# Patient Record
Sex: Male | Born: 1975 | Race: Black or African American | Hispanic: No | Marital: Married | Smoking: Current every day smoker
Health system: Southern US, Community
[De-identification: ages and names within clinical notes are randomized; demographics above are authoritative.]

## PROBLEM LIST (undated history)

## (undated) DIAGNOSIS — E785 Hyperlipidemia, unspecified: Secondary | ICD-10-CM

## (undated) DIAGNOSIS — M545 Low back pain, unspecified: Secondary | ICD-10-CM

## (undated) DIAGNOSIS — I10 Essential (primary) hypertension: Secondary | ICD-10-CM

## (undated) DIAGNOSIS — G8929 Other chronic pain: Secondary | ICD-10-CM

## (undated) DIAGNOSIS — J45909 Unspecified asthma, uncomplicated: Secondary | ICD-10-CM

## (undated) DIAGNOSIS — G4733 Obstructive sleep apnea (adult) (pediatric): Secondary | ICD-10-CM

## (undated) DIAGNOSIS — G4762 Sleep related leg cramps: Secondary | ICD-10-CM

## (undated) DIAGNOSIS — R079 Chest pain, unspecified: Secondary | ICD-10-CM

## (undated) DIAGNOSIS — R7303 Prediabetes: Secondary | ICD-10-CM

## (undated) DIAGNOSIS — T7840XA Allergy, unspecified, initial encounter: Secondary | ICD-10-CM

## (undated) HISTORY — DX: Morbid (severe) obesity due to excess calories: E66.01

## (undated) HISTORY — DX: Low back pain, unspecified: G89.29

## (undated) HISTORY — DX: Sleep related leg cramps: G47.62

## (undated) HISTORY — DX: Allergy, unspecified, initial encounter: T78.40XA

## (undated) HISTORY — DX: Low back pain, unspecified: M54.50

## (undated) HISTORY — DX: Low back pain: M54.5

## (undated) HISTORY — DX: Chest pain, unspecified: R07.9

## (undated) HISTORY — DX: Prediabetes: R73.03

## (undated) HISTORY — DX: Unspecified asthma, uncomplicated: J45.909

## (undated) HISTORY — DX: Obstructive sleep apnea (adult) (pediatric): G47.33

## (undated) HISTORY — DX: Hyperlipidemia, unspecified: E78.5

---

## 2012-09-09 ENCOUNTER — Encounter: Payer: Self-pay | Admitting: Emergency Medicine

## 2012-09-09 ENCOUNTER — Emergency Department
Admission: EM | Admit: 2012-09-09 | Discharge: 2012-09-09 | Disposition: A | Discharge status: Home / Self Care | Payer: 59 | Source: Home / Self Care | Attending: Family Medicine | Admitting: Family Medicine

## 2012-09-09 DIAGNOSIS — K0889 Other specified disorders of teeth and supporting structures: Secondary | ICD-10-CM

## 2012-09-09 DIAGNOSIS — K089 Disorder of teeth and supporting structures, unspecified: Secondary | ICD-10-CM

## 2012-09-09 HISTORY — DX: Essential (primary) hypertension: I10

## 2012-09-09 MED ORDER — HYDROCODONE-ACETAMINOPHEN 5-325 MG PO TABS: ORAL_TABLET | ORAL | Status: DC

## 2012-09-09 MED ORDER — CLINDAMYCIN HCL 150 MG PO CAPS: ORAL_CAPSULE | ORAL | Status: DC

## 2012-09-09 NOTE — ED Notes (Signed)
Patient has appointment with dentist on January 17th, has been waiting for insurance to go into effect.

## 2012-09-09 NOTE — ED Provider Notes (Signed)
History     CSN: [id number]  Arrival date & time 09/09/12  1318   First MD Initiated Contact with Patient 09/09/12 1349      Chief Complaint  Patient presents with  . Dental Pain     HPI Comments: Patient complains of persistent pain in his left lower wisdom tooth for two weeks.  No fevers, chills, and sweats.  He has a dental appt on 09/26/12  Patient is a 36 y.o. male presenting with tooth pain. The history is provided by the patient.  Dental PainThe primary symptoms include mouth pain and headaches. Primary symptoms do not include dental injury, oral bleeding, oral lesions, fever, shortness of breath, sore throat, angioedema or cough. Episode onset: 2 weeks ago. The symptoms are worsening. The symptoms occur constantly.  Additional symptoms include: dental sensitivity to temperature, gum swelling, gum tenderness and jaw pain. Additional symptoms do not include: purulent gums, trismus, facial swelling, trouble swallowing, pain with swallowing, excessive salivation, dry mouth, taste disturbance, smell disturbance, drooling, ear pain and swollen glands.    Past Medical History  Diagnosis Date  . Hypertension     History reviewed. No pertinent past surgical history.  Family History  Problem Relation Age of Onset  . Cancer Mother   . Asthma Father   . Hypertension Father   . Emphysema Father     History  Substance Use Topics  . Smoking status: Never Smoker   . Smokeless tobacco: Not on file  . Alcohol Use: Yes      Review of Systems  Constitutional: Negative for fever.  HENT: Negative for ear pain, sore throat, facial swelling, drooling and trouble swallowing.   Respiratory: Negative for cough and shortness of breath.   Neurological: Positive for headaches.  All other systems reviewed and are negative.    Allergies  Review of patient's allergies indicates no known allergies.  Home Medications   Current Outpatient Rx  Name  Route  Sig  Dispense  Refill  .  HYDROCHLOROTHIAZIDE 25 MG PO TABS   Oral   Take 25 mg by mouth daily.         . IBUPROFEN 800 MG PO TABS   Oral   Take 800 mg by mouth every 8 (eight) hours as needed.         Marland Kitchen CLINDAMYCIN HCL 150 MG PO CAPS      Take one cap by mouth every 6 hours   28 capsule   0   . HYDROCODONE-ACETAMINOPHEN 5-325 MG PO TABS      Take one by mouth at bedtime as needed for pain   10 tablet   0     BP 165/105  Pulse 81  Temp 97.9 F (36.6 C) (Oral)  Resp 16  Ht 6\' 1"  (1.854 m)  Wt 294 lb (133.358 kg)  BMI 38.79 kg/m2  SpO2 100%  Physical Exam  Nursing note and vitals reviewed. Constitutional: He appears well-developed and well-nourished. No distress.       Patient is obese (BMI 38.8)  HENT:  Head: Normocephalic.  Right Ear: Tympanic membrane and external ear normal.  Left Ear: Tympanic membrane and external ear normal.  Nose: Nose normal.  Mouth/Throat:         Left mandibular tricuspid partly erupted and tender to palpation.  Gingiva also tender but not swollen; does not appear fluctuant.  No purulent drainage.  Eyes: Conjunctivae normal are normal. Pupils are equal, round, and reactive to light.  Neck: Neck supple.  Lymphadenopathy:    He has no cervical adenopathy.    ED Course  Procedures  none      1. Toothache       MDM  Begin Clindamycin.  Lortab for pain at bedtime. Continue Ibuprofen 200mg , 4 tabs every 8 hours with food.  Begin warm salt water gargles Followup with dentist as scheduled.  Red flags discussed. If symptoms become significantly worse during the night or over the weekend, proceed to the local emergency room.         Lattie Haw, MD 09/09/12 [phone]

## 2012-09-09 NOTE — ED Notes (Signed)
Bottom left wisdom tooth pain x 2 weeks

## 2014-01-26 HISTORY — DX: Morbid (severe) obesity due to excess calories: E66.01

## 2015-12-08 ENCOUNTER — Emergency Department
Admission: EM | Admit: 2015-12-08 | Discharge: 2015-12-08 | Disposition: A | Discharge status: Home / Self Care | Payer: Commercial Managed Care - PPO | Source: Home / Self Care | Attending: Family Medicine | Admitting: Family Medicine

## 2015-12-08 DIAGNOSIS — J069 Acute upper respiratory infection, unspecified: Secondary | ICD-10-CM | POA: Diagnosis not present

## 2015-12-08 LAB — POCT INFLUENZA A/B
Influenza A, POC: NEGATIVE
Influenza B, POC: NEGATIVE

## 2015-12-08 LAB — POCT RAPID STREP A (OFFICE): Rapid Strep A Screen: NEGATIVE

## 2015-12-08 NOTE — Discharge Instructions (Signed)
You may take 400-600mg Ibuprofen (Motrin) every 6-8 hours for fever and pain  °Alternate with Tylenol  °You may take 500mg Tylenol every 4-6 hours as needed for fever and pain  °Follow-up with your primary care provider next week for recheck of symptoms if not improving.  °Be sure to drink plenty of fluids and rest, at least 8hrs of sleep a night, preferably more while you are sick. °Return urgent care or go to closest ER if you cannot keep down fluids/signs of dehydration, fever not reducing with Tylenol, difficulty breathing/wheezing, stiff neck, worsening condition, or other concerns (see below)  ° ° °Cool Mist Vaporizers °Vaporizers may help relieve the symptoms of a cough and cold. They add moisture to the air, which helps mucus to become thinner and less sticky. This makes it easier to breathe and cough up secretions. Cool mist vaporizers do not cause serious burns like hot mist vaporizers, which may also be called steamers or humidifiers. Vaporizers have not been proven to help with colds. You should not use a vaporizer if you are allergic to mold. °HOME CARE INSTRUCTIONS °· Follow the package instructions for the vaporizer. °· Do not use anything other than distilled water in the vaporizer. °· Do not run the vaporizer all of the time. This can cause mold or bacteria to grow in the vaporizer. °· Clean the vaporizer after each time it is used. °· Clean and dry the vaporizer well before storing it. °· Stop using the vaporizer if worsening respiratory symptoms develop. °  °This information is not intended to replace advice given to you by your health care provider. Make sure you discuss any questions you have with your health care provider. °  °Document Released: 05/24/2004 Document Revised: 09/01/2013 Document Reviewed: 01/14/2013 °Elsevier Interactive Patient Education ©2016 Elsevier Inc. ° °

## 2015-12-08 NOTE — ED Provider Notes (Signed)
CSN: [id number]     Arrival date & time 12/08/15  1021 History   First MD Initiated Contact with Patient 12/08/15 1035     Chief Complaint  Patient presents with  . Sore Throat  . Cough   (Consider location/radiation/quality/duration/timing/severity/associated sxs/prior Treatment) HPI The pt is a 40yo male presenting to [facility] with c/o nasal congestion, scratchy throat, and mildly productive cough with yellow mucous.  He also reports 2-3 episodes of loose stools. Denies blood or mucous in stools.  Denies sick contacts or recent travel.  He has been taking Mucinex and Sudafed with moderate relief. He did not get the flu vaccine this season.    Past Medical History  Diagnosis Date  . Hypertension    History reviewed. No pertinent past surgical history. Family History  Problem Relation Age of Onset  . Cancer Mother   . Asthma Father   . Hypertension Father   . Emphysema Father    Social History  Substance Use Topics  . Smoking status: Never Smoker   . Smokeless tobacco: None  . Alcohol Use: Yes    Review of Systems  Constitutional: Negative for fever and chills.  HENT: Positive for congestion, postnasal drip, rhinorrhea, sneezing and sore throat. Negative for ear pain, sinus pressure, trouble swallowing and voice change.   Respiratory: Positive for cough. Negative for shortness of breath.   Cardiovascular: Negative for chest pain and palpitations.  Gastrointestinal: Positive for diarrhea ( loose stool). Negative for nausea, vomiting and abdominal pain.  Musculoskeletal: Negative for myalgias, back pain and arthralgias.  Skin: Negative for rash.  Neurological: Negative for dizziness, light-headedness and headaches.    Allergies  Review of patient's allergies indicates no known allergies.  Home Medications   Prior to Admission medications   Medication Sig Start Date End Date Taking? Authorizing Provider  clindamycin (CLEOCIN) 150 MG capsule Take one cap by mouth every 6 hours  09/09/12   Lattie Haw, MD  hydrochlorothiazide (HYDRODIURIL) 25 MG tablet Take 25 mg by mouth daily.    Historical Provider, MD  HYDROcodone-acetaminophen (NORCO/VICODIN) 5-325 MG per tablet Take one by mouth at bedtime as needed for pain 09/09/12   Lattie Haw, MD  ibuprofen (ADVIL,MOTRIN) 800 MG tablet Take 800 mg by mouth every 8 (eight) hours as needed.    Historical Provider, MD   Meds Ordered and Administered this Visit  Medications - No data to display  BP 154/97 mmHg  Pulse 104  Temp(Src) 98.3 F (36.8 C) (Oral)  Ht  (1.88 m)  Wt 295 lb 4 oz (133.925 kg)  BMI 37.89 kg/m2  SpO2 99% No data found.   Physical Exam  Constitutional: He appears well-developed and well-nourished.  HENT:  Head: Normocephalic and atraumatic.  Right Ear: Tympanic membrane normal.  Left Ear: Tympanic membrane normal.  Nose: Mucosal edema present. Right sinus exhibits no maxillary sinus tenderness and no frontal sinus tenderness. Left sinus exhibits no maxillary sinus tenderness and no frontal sinus tenderness.  Mouth/Throat: Uvula is midline and mucous membranes are normal. Posterior oropharyngeal erythema present.  Eyes: Conjunctivae are normal. No scleral icterus.  Neck: Normal range of motion. Neck supple.  Cardiovascular: Normal rate, regular rhythm and normal heart sounds.   Pulmonary/Chest: Effort normal and breath sounds normal. No respiratory distress. He has no wheezes. He has no rales.  Abdominal: Soft. He exhibits no distension. There is no tenderness.  Musculoskeletal: Normal range of motion.  Neurological: He is alert.  Skin: Skin is warm and  dry.  Nursing note and vitals reviewed.   ED Course  Procedures (including critical care time)  Labs Review Labs Reviewed  POCT RAPID STREP A (OFFICE)  POCT INFLUENZA A/B    Imaging Review No results found.    MDM   1. Acute upper respiratory infection    Pt c/o URI symptoms since yesterday.  Rapid strep and flu:  NEGATIVE  No evidence of bacterial infection on exam. Pt appears well.  Encouraged continued symptomatic treatment.  Advised pt to use acetaminophen and ibuprofen as needed for fever and pain. Encouraged rest and fluids. F/u with PCP in 7-10 days if not improving, sooner if worsening. Pt verbalized understanding and agreement with tx plan.     Junius Finnerrin O'Malley, PA-C 12/08/15 1137

## 2015-12-08 NOTE — ED Notes (Signed)
Started with a scratchy itchy throat yesterday morning.  Became worse as the day went on.  This morning having coughing, chest pain when coughing, runny nose, coughing up thick yellow mucous, and having diarrhea.

## 2015-12-11 ENCOUNTER — Telehealth: Payer: Self-pay

## 2016-05-14 ENCOUNTER — Emergency Department
Admission: EM | Admit: 2016-05-14 | Discharge: 2016-05-14 | Disposition: A | Discharge status: Home / Self Care | Payer: Commercial Managed Care - PPO | Source: Home / Self Care | Attending: Family Medicine | Admitting: Family Medicine

## 2016-05-14 ENCOUNTER — Encounter: Payer: Self-pay | Admitting: *Deleted

## 2016-05-14 DIAGNOSIS — K047 Periapical abscess without sinus: Secondary | ICD-10-CM

## 2016-05-14 MED ORDER — ACETAMINOPHEN 325 MG PO TABS
650.0000 mg | ORAL_TABLET | Freq: Once | ORAL | Status: AC
  Administered 2016-05-14: 650 mg via ORAL

## 2016-05-14 MED ORDER — AMOXICILLIN 500 MG PO CAPS: 500.0000 mg | ORAL_CAPSULE | Freq: Three times a day (TID) | ORAL | 0 refills | Status: DC

## 2016-05-14 NOTE — ED Triage Notes (Signed)
Pt c/o RT side lower wisdom tooth pain and swelling x 3 days. Denies fever. He took IBF 400mg  at 0600 today.

## 2016-05-14 NOTE — ED Provider Notes (Signed)
CSN: [id number]     Arrival date & time 05/14/16  [id number] History   First MD Initiated Contact with Patient 05/14/16 0915     Chief Complaint  Patient presents with  . Dental Pain   (Consider location/radiation/quality/duration/timing/severity/associated sxs/prior Treatment) HPI Randy HoffmannJames M Thompson is a 40 y.o. male presenting to UC with c/o Right lower back pain over his wisdom tooth that started about 2 days ago. Pain is aching and throbbing.  Pain is 8/10, worse with chewing.  He reports hx of abscess in the past and did need a tooth removed. He does have a dentist but [facility]  Denies chewing tobacco or smoking. No fever, chills, n/v/d.    Past Medical History:  Diagnosis Date  . Hypertension    History reviewed. No pertinent surgical history. Family History  Problem Relation Age of Onset  . Cancer Mother   . Asthma Father   . Hypertension Father   . Emphysema Father    Social History  Substance Use Topics  . Smoking status: Never Smoker  . Smokeless tobacco: Never Used  . Alcohol use Yes    Review of Systems  Constitutional: Negative for chills and fever.  HENT: Positive for dental problem. Negative for congestion, ear pain and sore throat.   Gastrointestinal: Negative for nausea and vomiting.    Allergies  Review of patient's allergies indicates no known allergies.  Home Medications   Prior to Admission medications   Medication Sig Start Date End Date Taking? Authorizing Provider  amLODipine (NORVASC) 5 MG tablet Take 5 mg by mouth daily.   Yes Historical Provider, MD  amoxicillin (AMOXIL) 500 MG capsule Take 1 capsule (500 mg total) by mouth 3 (three) times daily. 05/14/16   Junius FinnerErin O'Malley, PA-C  ibuprofen (ADVIL,MOTRIN) 800 MG tablet Take 800 mg by mouth every 8 (eight) hours as needed.    Historical Provider, MD   Meds Ordered and Administered this Visit   Medications  acetaminophen (TYLENOL) tablet 650 mg (650 mg Oral Given 05/14/16  0912)    BP (!) 162/112 (BP Location: Left Arm)   Pulse 83   Temp 98.1 F (36.7 C) (Oral)   Resp 18   Ht 6\' 2"  (1.88 m)   Wt 297 lb (134.7 kg)   SpO2 100%   BMI 38.13 kg/m  No data found.   Physical Exam  Constitutional: He is oriented to person, place, and time. He appears well-developed and well-nourished.  HENT:  Head: Normocephalic and atraumatic.  Nose: Nose normal.  Mouth/Throat: Uvula is midline, oropharynx is clear and moist and mucous membranes are normal. No trismus in the jaw. Dental caries present. No dental abscesses.    Multiple dental caries and tartar buildup. Tenderness to Right lower back teeth along tongue side of gingiva. Mild gingival erythema and edema with tenderness. No bleeding or discharge.   Eyes: EOM are normal.  Neck: Normal range of motion.  Cardiovascular: Normal rate.   Pulmonary/Chest: Effort normal.  Musculoskeletal: Normal range of motion.  Neurological: He is alert and oriented to person, place, and time.  Skin: Skin is warm and dry.  Psychiatric: He has a normal mood and affect. His behavior is normal.  Nursing note and vitals reviewed.   Urgent Care Course   Clinical Course    Procedures (including critical care time)  Labs Review Labs Reviewed - No data to display  Imaging Review No results found.   MDM   1. Dental abscess  Pt presenting to UC with dental pain. Hx of dental abscesses in the past.  Exam c/w early dental abscess. No large abscess to indicate I&D at this time.  Rx: amoxicillin Encouraged salt water gargles. F/u with dentist later this week for recheck of symptoms and continued dental care. Patient verbalized understanding and agreement with treatment plan.     Junius Finner, PA-C 05/14/16 [phone]

## 2017-01-21 DIAGNOSIS — G4733 Obstructive sleep apnea (adult) (pediatric): Secondary | ICD-10-CM | POA: Insufficient documentation

## 2017-01-21 HISTORY — DX: Obstructive sleep apnea (adult) (pediatric): G47.33

## 2017-03-05 LAB — LIPID PANEL
CHOLESTEROL: 215 — AB (ref 0–200)
HDL: 38 (ref 35–70)
LDL CALC: 132
LDl/HDL Ratio: 5.7
Triglycerides: 291 — AB (ref 40–160)

## 2017-03-05 LAB — BASIC METABOLIC PANEL
BUN: 10 (ref 4–21)
Creatinine: 1.3 (ref 0.6–1.3)
GLUCOSE: 87
POTASSIUM: 4.5 (ref 3.4–5.3)
Sodium: 139 (ref 137–147)

## 2017-03-05 LAB — HEMOGLOBIN A1C: HEMOGLOBIN A1C: 5.7

## 2017-05-10 ENCOUNTER — Encounter: Payer: Self-pay | Admitting: Physician Assistant

## 2017-05-10 ENCOUNTER — Ambulatory Visit (INDEPENDENT_AMBULATORY_CARE_PROVIDER_SITE_OTHER): Payer: Commercial Managed Care - PPO

## 2017-05-10 ENCOUNTER — Ambulatory Visit (INDEPENDENT_AMBULATORY_CARE_PROVIDER_SITE_OTHER): Payer: Commercial Managed Care - PPO | Admitting: Physician Assistant

## 2017-05-10 VITALS — BP 116/72 | HR 93 | Ht 73.0 in | Wt 283.5 lb

## 2017-05-10 DIAGNOSIS — I10 Essential (primary) hypertension: Secondary | ICD-10-CM | POA: Diagnosis not present

## 2017-05-10 DIAGNOSIS — G8929 Other chronic pain: Secondary | ICD-10-CM

## 2017-05-10 DIAGNOSIS — R7303 Prediabetes: Secondary | ICD-10-CM

## 2017-05-10 DIAGNOSIS — G4762 Sleep related leg cramps: Secondary | ICD-10-CM | POA: Insufficient documentation

## 2017-05-10 DIAGNOSIS — M25562 Pain in left knee: Secondary | ICD-10-CM | POA: Insufficient documentation

## 2017-05-10 DIAGNOSIS — R079 Chest pain, unspecified: Secondary | ICD-10-CM

## 2017-05-10 DIAGNOSIS — E785 Hyperlipidemia, unspecified: Secondary | ICD-10-CM

## 2017-05-10 DIAGNOSIS — Z7689 Persons encountering health services in other specified circumstances: Secondary | ICD-10-CM

## 2017-05-10 HISTORY — DX: Sleep related leg cramps: G47.62

## 2017-05-10 HISTORY — DX: Prediabetes: R73.03

## 2017-05-10 HISTORY — DX: Other chronic pain: G89.29

## 2017-05-10 MED ORDER — AMLODIPINE BESYLATE 5 MG PO TABS: 5.0000 mg | ORAL_TABLET | Freq: Every day | ORAL | 1 refills | Status: DC

## 2017-05-10 MED ORDER — MELOXICAM 15 MG PO TABS: 15.0000 mg | ORAL_TABLET | Freq: Every day | ORAL | 0 refills | Status: DC

## 2017-05-10 MED ORDER — B COMPLEX VITAMINS PO CAPS: 1.0000 | ORAL_CAPSULE | Freq: Every day | ORAL | 11 refills | Status: DC

## 2017-05-10 NOTE — Patient Instructions (Signed)
-   Meloxicam once daily for 2 weeks, then daily as needed. No Ibuprofen or other OTC pain relievers except Tylenol - X-ray today   Knee Pain, Adult Many things can cause knee pain. The pain often goes away on its own with time and rest. If the pain does not go away, tests may be done to find out what is causing the pain. Follow these instructions at home: Activity  Rest your knee.  Do not do things that cause pain.  Avoid activities where both feet leave the ground at the same time (high-impact activities). Examples are running, jumping rope, and doing jumping jacks. General instructions  Take medicines only as told by your doctor.  Raise (elevate) your knee when you are resting. Make sure your knee is higher than your heart.  Sleep with a pillow under your knee.  If told, put ice on the knee: ? Put ice in a plastic bag. ? Place a towel between your skin and the bag. ? Leave the ice on for 20 minutes, 2-3 times a day.  Ask your doctor if you should wear an elastic knee support.  Lose weight if you are overweight. Being overweight can make your knee hurt more.  Do not use any tobacco products. These include cigarettes, chewing tobacco, or electronic cigarettes. If you need help quitting, ask your doctor. Smoking may slow down healing. Contact a doctor if:  The pain does not stop.  The pain changes or gets worse.  You have a fever along with knee pain.  Your knee gives out or locks up.  Your knee swells, and becomes worse. Get help right away if:  Your knee feels warm.  You cannot move your knee.  You have very bad knee pain.  You have chest pain.  You have trouble breathing. Summary  Many things can cause knee pain. The pain often goes away on its own with time and rest.  Avoid activities that put stress on your knee. These include running and jumping rope.  Get help right away if you cannot move your knee, or if your knee feels warm, or if you have trouble  breathing. This information is not intended to replace advice given to you by your health care provider. Make sure you discuss any questions you have with your health care provider. Document Released: 11/23/2008 Document Revised: 08/21/2016 Document Reviewed: 08/21/2016 Elsevier Interactive Patient Education  2017 [employer]

## 2017-05-10 NOTE — Progress Notes (Signed)
HPI:                                                                Randy HoffmannJames M Thompson is a 41 y.o. male [facility] Medicine today to establish care  Patient reports left anterior medial thigh pain x 2 weeks. Pain is moderate, persistent. Denies known injury or trauma. Denies paresthesias, numbness tingling. Denies weakness or falls. Denies swelling or redness. Patient also reports nightly cramps in his right upper leg that wake him from sleep.   HTN: taking Amlodipine daily. Compliant with medications. Does not monitor BP's at home. Denies vision change, headache, chest pain with exertion, orthopnea, lightheadedness, syncope and edema. He has a history of chronic chest pain. He had an exercise stress test that was negative for ischemia, but Risk factors include: male sex, obesity, OSA, HLD  Mild OSA: not on CPAP therapy  HLD: taking pravastatin daily   Past Medical History:  Diagnosis Date  . Allergy   . Asthma    childhood  . Chronic chest pain 05/10/2017  . Chronic low back pain   . Hyperlipidemia   . Hypertension   . 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 Morbid obesity (HCC) 01/26/2014  . Nocturnal leg cramps 05/10/2017  . Prediabetes 05/10/2017   History reviewed. No pertinent surgical history. Social History  Substance Use Topics  . Smoking status: Never Smoker  . Smokeless tobacco: Never Used  . Alcohol use Yes   family history includes Asthma in his father; Cancer in his mother; Emphysema in his father; Hypertension in his father.  ROS: negative except as noted in the HPI  Medications: Current Outpatient Prescriptions  Medication Sig Dispense Refill  . amLODipine (NORVASC) 5 MG tablet Take 1 tablet (5 mg total) by mouth daily. 90 tablet 1  . loratadine (CLARITIN) 10 MG tablet Take 10 mg by mouth daily.    . Melatonin 5 MG CAPS Take 5 mg by mouth daily as needed.    . pravastatin (PRAVACHOL) 20 MG tablet Take 20  mg by mouth daily.    Marland Kitchen. b complex vitamins capsule Take 1 capsule by mouth daily. 30 capsule 11  . meloxicam (MOBIC) 15 MG tablet Take 1 tablet (15 mg total) by mouth daily. 30 tablet 0   No current facility-administered medications for this visit.    Allergies  Allergen Reactions  . Penicillins Rash       Objective:  BP 116/72   Pulse 93   Ht 6\' 1"  (1.854 m)   Wt 283 lb 8 oz (128.6 kg)   SpO2 99%   BMI 37.40 kg/m  Gen:  alert, not ill-appearing, no distress, appropriate for age, obese male HEENT: head normocephalic without obvious abnormality, conjunctiva and cornea clear, trachea midline Pulm: Normal work of breathing, normal phonation CV: normal rate, regular rhythm, s1 and s2 distinct, no murmurs Neuro: alert and oriented x 3, no tremor MSK: extremities atraumatic, left knee without visible swelling or effusion, no redness or warmth of the joint, there is medial joint line tenderness and pain with extension, right knee normal, normal gait and station Skin: intact, no rashes on exposed skin, no jaundice, no cyanosis Psych: well-groomed, cooperative, good eye contact, euthymic mood, affect  mood-congruent, speech is articulate, and thought processes clear and goal-directed  Depression screen [facility] 2/9 05/10/2017  Decreased Interest 0  Down, Depressed, Hopeless 0  PHQ - 2 Score 0     No results found for this or any previous visit (from the past 72 hour(s)). Dg Knee 1-2 Views Right  Result Date: 05/11/2017 CLINICAL DATA:  Pt with Lt knee pain for 2 weeks. No hx surg. Bilateral ap and tunnel views per DR. EXAM: RIGHT KNEE - 1-2 VIEW COMPARISON:  None. FINDINGS: Standing views demonstrates mild narrowing of the medial joint space which is favored within normal limits. No osteophytosis. No joint effusion. IMPRESSION: No acute osseous abnormality. Electronically Signed   By: Genevive Bi M.D.   On: 05/11/2017 08:45   Dg Knee Complete 4 Views Left  Result Date:  05/11/2017 CLINICAL DATA:  Bilateral knee pain. EXAM: LEFT KNEE - COMPLETE 4+ VIEW COMPARISON:  None. FINDINGS: Standing view of the knees demonstrate mild joint space narrowing medially which isfelt within normal limits. No osteophytosis. No joint effusion. IMPRESSION: No osseous abnormality. Electronically Signed   By: Genevive Bi M.D.   On: 05/11/2017 08:33      Assessment and Plan: 41 y.o. male with   1. Encounter to establish care - reviewed PMH, PSH, PFH, medications, allergies - reviewed health maintenance - negative PHQ2  2. Hypertension goal BP (blood pressure) < 130/80 BP Readings from Last 3 Encounters:  05/10/17 116/72  05/14/16 (!) 162/112  12/08/15 154/97  - BP in range today. Cont Norvasc - amLODipine (NORVASC) 5 MG tablet; Take 1 tablet (5 mg total) by mouth daily.  Dispense: 90 tablet; Refill: 1  3. Nocturnal leg cramps - lifestyle measures including regular exercise, hydration, stretching before bed. Trial of B complex vitamin daily - b complex vitamins capsule; Take 1 capsule by mouth daily.  Dispense: 30 capsule; Refill: 11  4. Acute pain of left knee - daily antiinflammatory, knee x-rays, avoid high impact activity, follow-up with Sports Medicine - meloxicam (MOBIC) 15 MG tablet; Take 1 tablet (15 mg total) by mouth daily.  Dispense: 30 tablet; Refill: 0 - DG Knee Complete 4 Views Left; Future - DG Knee 1-2 Views Right; Future  5. Prediabetes - A1C 5.7 (03/05/17) - recheck A1C in 6 months  6. Hyperlipidemia - taking pravastatin daily - fasting lipid panel in 6 months  Patient education and anticipatory guidance given Patient agrees with treatment plan Follow-up in 2 weeks with Sports Medicine for knee pain or sooner as needed if symptoms worsen or fail to improve  Levonne Hubert PA-C

## 2017-05-14 ENCOUNTER — Encounter: Payer: Self-pay | Admitting: Physician Assistant

## 2017-05-14 DIAGNOSIS — J3089 Other allergic rhinitis: Secondary | ICD-10-CM | POA: Insufficient documentation

## 2017-05-14 DIAGNOSIS — E785 Hyperlipidemia, unspecified: Secondary | ICD-10-CM | POA: Insufficient documentation

## 2017-05-14 DIAGNOSIS — Z8709 Personal history of other diseases of the respiratory system: Secondary | ICD-10-CM | POA: Insufficient documentation

## 2017-05-14 NOTE — Progress Notes (Signed)
Hi Taivon,  Your knee x-rays are normal. Continue with treatment plan discussed and follow-up with Sports Medicine in approximately 2 weeks if knee pain persists.  Best, Vinetta Bergamoharley

## 2017-05-16 ENCOUNTER — Telehealth: Payer: Self-pay | Admitting: Physician Assistant

## 2017-05-16 NOTE — Telephone Encounter (Signed)
Wife returning call regarding "recommendations for husband".  Thank you.

## 2017-05-17 NOTE — Telephone Encounter (Signed)
Left vm for wife to return call to clinic. Not sure what she was calling about. -EH/RMA

## 2017-05-20 ENCOUNTER — Ambulatory Visit (INDEPENDENT_AMBULATORY_CARE_PROVIDER_SITE_OTHER): Payer: Commercial Managed Care - PPO

## 2017-05-20 ENCOUNTER — Ambulatory Visit (INDEPENDENT_AMBULATORY_CARE_PROVIDER_SITE_OTHER): Payer: Commercial Managed Care - PPO | Admitting: Sports Medicine

## 2017-05-20 ENCOUNTER — Encounter: Payer: Self-pay | Admitting: Sports Medicine

## 2017-05-20 DIAGNOSIS — M5416 Radiculopathy, lumbar region: Secondary | ICD-10-CM

## 2017-05-20 MED ORDER — PREDNISONE 50 MG PO TABS: ORAL_TABLET | ORAL | 0 refills | Status: DC

## 2017-05-20 NOTE — Progress Notes (Signed)
   Subjective:    I'm seeing this patient as a consultation for:  Gena Frayharley Cummings, PA-C  CC: left knee and leg pain  HPI: This is a pleasant 41 year old male, for the past several months he said pain over the lateral aspect of his left knee, radiating from the lateral thigh, over to the anterior lower leg to the foot. Also gets some numbness and tingling in the right foot. Symptoms are moderate, persistent, worse with driving and sitting. No bowel or bladder dysfunction, saddle numbness, constitutional symptoms.  Past medical history, Surgical history, Family history not pertinant except as noted below, Social history, Allergies, and medications have been entered into the medical record, reviewed, and no changes needed.   Review of Systems: No headache, visual changes, nausea, vomiting, diarrhea, constipation, dizziness, abdominal pain, skin rash, fevers, chills, night sweats, weight loss, swollen lymph nodes, body aches, joint swelling, muscle aches, chest pain, shortness of breath, mood changes, visual or auditory hallucinations.   Objective:   General: Well Developed, well nourished, and in no acute distress.  Neuro:  Extra-ocular muscles intact, able to move all 4 extremities, sensation grossly intact.  Deep tendon reflexes tested were normal. Psych: Alert and oriented, mood congruent with affect. ENT:  Ears and nose appear unremarkable.  Hearing grossly normal. Neck: Unremarkable overall appearance, trachea midline.  No visible thyroid enlargement. Eyes: Conjunctivae and lids appear unremarkable.  Pupils equal and round. Skin: Warm and dry, no rashes noted.  Cardiovascular: Pulses palpable, no extremity edema. Left Knee: Normal to inspection with no erythema or effusion or obvious bony abnormalities. Palpation normal with no warmth or joint line tenderness or patellar tenderness or condyle tenderness. ROM normal in flexion and extension and lower leg rotation. Ligaments with solid  consistent endpoints including ACL, PCL, LCL, MCL. Negative Mcmurray's and provocative meniscal tests. Non painful patellar compression. Patellar and quadriceps tendons unremarkable. Hamstring and quadriceps strength is normal. Back Exam:  Inspection: Unremarkable  Motion: Flexion 45 deg, Extension 45 deg, Side Bending to 45 deg bilaterally,  Rotation to 45 deg bilaterally  SLR laying: Negative  XSLR laying: Negative  Palpable tenderness: None. FABER: negative. Sensory change: Gross sensation intact to all lumbar and sacral dermatomes.  Reflexes: 2+ at both patellar tendons, 2+ at achilles tendons, Babinski's downgoing.  Strength at foot  Plantar-flexion: 5/5 Dorsi-flexion: 5/5 Eversion: 5/5 Inversion: 5/5  Leg strength  Quad: 5/5 Hamstring: 5/5 Hip flexor: 5/5 Hip abductors: 5/5  Gait unremarkable.  Lumbar spine x-rays reviewed, there is minimal lower thoracic degenerative disc disease but overall the lumbar spine looks okay.  Impression and Recommendations:   This case required medical decision making of moderate complexity.  Lumbar radiculitis Prednisone, lumbar spine x-rays, home rehabilitation exercises.  Return to see me in one month, MRI for interventional planning if no better. Meloxicam is only mild to moderately effective.  ___________________________________________ Ihor Austinhomas J. Benjamin Stainhekkekandam, M.D., ABFM., CAQSM. Primary Care and Sports Medicine [facility]  Adjunct Instructor of Family Medicine  [employer]

## 2017-05-20 NOTE — Assessment & Plan Note (Signed)
Prednisone, lumbar spine x-rays, home rehabilitation exercises.  Return to see me in one month, MRI for interventional planning if no better. Meloxicam is only mild to moderately effective.

## 2017-05-28 ENCOUNTER — Ambulatory Visit: Payer: Commercial Managed Care - PPO | Admitting: Physician Assistant

## 2017-06-17 ENCOUNTER — Encounter: Payer: Self-pay | Admitting: Sports Medicine

## 2017-06-17 ENCOUNTER — Ambulatory Visit (INDEPENDENT_AMBULATORY_CARE_PROVIDER_SITE_OTHER): Payer: Commercial Managed Care - PPO | Admitting: Sports Medicine

## 2017-06-17 DIAGNOSIS — M5416 Radiculopathy, lumbar region: Secondary | ICD-10-CM

## 2017-06-17 NOTE — Assessment & Plan Note (Signed)
Improved significantly but not really doing the rehabilitation exercises. He will get more diligent with the rehabilitation exercises, and if still with symptoms in a month we will proceed with an MRI for interventional planning.

## 2017-06-17 NOTE — Progress Notes (Signed)
  Subjective:    CC: Follow-up  HPI: This is a pleasant 41 year old male with lumbar radiculitis, we did prednisone, rehabilitation exercises, he's improved considerably, he admits to not being compliant with his rehabilitation exercises. No bowel or bladder dysfunction, saddle numbness, constitutional symptoms.  Past medical history:  Negative.  See flowsheet/record as well for more information.  Surgical history: Negative.  See flowsheet/record as well for more information.  Family history: Negative.  See flowsheet/record as well for more information.  Social history: Negative.  See flowsheet/record as well for more information.  Allergies, and medications have been entered into the medical record, reviewed, and no changes needed.   Review of Systems: No fevers, chills, night sweats, weight loss, chest pain, or shortness of breath.   Objective:    General: Well Developed, well nourished, and in no acute distress.  Neuro: Alert and oriented x3, extra-ocular muscles intact, sensation grossly intact.  HEENT: Normocephalic, atraumatic, pupils equal round reactive to light, neck supple, no masses, no lymphadenopathy, thyroid nonpalpable.  Skin: Warm and dry, no rashes. Cardiac: Regular rate and rhythm, no murmurs rubs or gallops, no lower extremity edema.  Respiratory: Clear to auscultation bilaterally. Not using accessory muscles, speaking in full sentences. Back Exam:  Inspection: Unremarkable  Motion: Flexion 45 deg, Extension 45 deg, Side Bending to 45 deg bilaterally,  Rotation to 45 deg bilaterally  SLR laying: Negative  XSLR laying: Negative  Palpable tenderness: None. FABER: negative. Sensory change: Gross sensation intact to all lumbar and sacral dermatomes.  Reflexes: 2+ at both patellar tendons, 2+ at achilles tendons, Babinski's downgoing.  Strength at foot  Plantar-flexion: 5/5 Dorsi-flexion: 5/5 Eversion: 5/5 Inversion: 5/5  Leg strength  Quad: 5/5 Hamstring: 5/5 Hip  flexor: 5/5 Hip abductors: 5/5  Gait unremarkable.  Impression and Recommendations:    Lumbar radiculitis Improved significantly but not really doing the rehabilitation exercises. He will get more diligent with the rehabilitation exercises, and if still with symptoms in a month we will proceed with an MRI for interventional planning.  ___________________________________________ Ihor Austin. Benjamin Stain, M.D., ABFM., CAQSM. Primary Care and Sports Medicine [facility]  Adjunct Instructor of Family Medicine  [employer]

## 2017-06-19 ENCOUNTER — Encounter: Payer: Self-pay | Admitting: Sports Medicine

## 2017-06-20 MED ORDER — MELOXICAM 15 MG PO TABS: ORAL_TABLET | ORAL | 3 refills | Status: DC

## 2017-06-20 NOTE — Telephone Encounter (Signed)
Sending in meloxicam.

## 2017-06-23 ENCOUNTER — Encounter: Payer: Self-pay | Admitting: Physician Assistant

## 2017-06-23 DIAGNOSIS — E785 Hyperlipidemia, unspecified: Secondary | ICD-10-CM

## 2017-06-25 ENCOUNTER — Other Ambulatory Visit: Payer: Self-pay | Admitting: Physician Assistant

## 2017-06-25 DIAGNOSIS — E782 Mixed hyperlipidemia: Secondary | ICD-10-CM

## 2017-06-25 MED ORDER — PRAVASTATIN SODIUM 20 MG PO TABS: 20.0000 mg | ORAL_TABLET | Freq: Every day | ORAL | 0 refills | Status: DC

## 2017-06-25 NOTE — Telephone Encounter (Signed)
rx sent per PCP recommendation.

## 2017-06-26 ENCOUNTER — Encounter: Payer: Self-pay | Admitting: Physician Assistant

## 2017-07-20 ENCOUNTER — Other Ambulatory Visit: Payer: Self-pay | Admitting: Physician Assistant

## 2017-07-20 DIAGNOSIS — E785 Hyperlipidemia, unspecified: Secondary | ICD-10-CM

## 2017-08-20 ENCOUNTER — Other Ambulatory Visit: Payer: Self-pay | Admitting: Physician Assistant

## 2017-08-20 DIAGNOSIS — E785 Hyperlipidemia, unspecified: Secondary | ICD-10-CM

## 2017-09-30 ENCOUNTER — Other Ambulatory Visit: Payer: Self-pay

## 2017-09-30 DIAGNOSIS — E785 Hyperlipidemia, unspecified: Secondary | ICD-10-CM

## 2017-09-30 MED ORDER — PRAVASTATIN SODIUM 20 MG PO TABS: 20.0000 mg | ORAL_TABLET | Freq: Every day | ORAL | 0 refills | Status: DC

## 2017-11-10 ENCOUNTER — Other Ambulatory Visit: Payer: Self-pay | Admitting: Physician Assistant

## 2017-11-10 DIAGNOSIS — E785 Hyperlipidemia, unspecified: Secondary | ICD-10-CM

## 2017-11-13 ENCOUNTER — Other Ambulatory Visit: Payer: Self-pay | Admitting: Physician Assistant

## 2017-11-13 DIAGNOSIS — E785 Hyperlipidemia, unspecified: Secondary | ICD-10-CM

## 2017-11-13 MED ORDER — PRAVASTATIN SODIUM 20 MG PO TABS: 20.0000 mg | ORAL_TABLET | Freq: Every day | ORAL | 0 refills | Status: DC

## 2017-12-22 ENCOUNTER — Other Ambulatory Visit: Payer: Self-pay | Admitting: Physician Assistant

## 2017-12-22 DIAGNOSIS — I10 Essential (primary) hypertension: Secondary | ICD-10-CM

## 2017-12-22 DIAGNOSIS — E785 Hyperlipidemia, unspecified: Secondary | ICD-10-CM

## 2018-01-12 ENCOUNTER — Other Ambulatory Visit: Payer: Self-pay | Admitting: Physician Assistant

## 2018-01-12 DIAGNOSIS — E785 Hyperlipidemia, unspecified: Secondary | ICD-10-CM

## 2018-01-23 ENCOUNTER — Other Ambulatory Visit: Payer: Self-pay | Admitting: Physician Assistant

## 2018-01-23 DIAGNOSIS — E785 Hyperlipidemia, unspecified: Secondary | ICD-10-CM

## 2018-01-24 ENCOUNTER — Encounter: Payer: Self-pay | Admitting: Physician Assistant

## 2018-01-24 DIAGNOSIS — E785 Hyperlipidemia, unspecified: Secondary | ICD-10-CM

## 2018-01-24 MED ORDER — PRAVASTATIN SODIUM 20 MG PO TABS
ORAL_TABLET | ORAL | 0 refills | Status: DC
Start: 2018-01-24 — End: 2018-02-26

## 2018-02-19 ENCOUNTER — Encounter: Payer: Self-pay | Admitting: Physician Assistant

## 2018-02-25 ENCOUNTER — Encounter: Payer: Self-pay | Admitting: Physician Assistant

## 2018-02-25 ENCOUNTER — Ambulatory Visit (INDEPENDENT_AMBULATORY_CARE_PROVIDER_SITE_OTHER): Payer: Commercial Managed Care - PPO | Admitting: Physician Assistant

## 2018-02-25 VITALS — BP 113/76 | HR 96 | Wt 291.0 lb

## 2018-02-25 DIAGNOSIS — F17290 Nicotine dependence, other tobacco product, uncomplicated: Secondary | ICD-10-CM | POA: Diagnosis not present

## 2018-02-25 DIAGNOSIS — I10 Essential (primary) hypertension: Secondary | ICD-10-CM

## 2018-02-25 DIAGNOSIS — E785 Hyperlipidemia, unspecified: Secondary | ICD-10-CM | POA: Diagnosis not present

## 2018-02-25 DIAGNOSIS — Z79899 Other long term (current) drug therapy: Secondary | ICD-10-CM

## 2018-02-25 DIAGNOSIS — Z0000 Encounter for general adult medical examination without abnormal findings: Secondary | ICD-10-CM | POA: Diagnosis not present

## 2018-02-25 DIAGNOSIS — R7303 Prediabetes: Secondary | ICD-10-CM

## 2018-02-25 DIAGNOSIS — F17200 Nicotine dependence, unspecified, uncomplicated: Secondary | ICD-10-CM | POA: Insufficient documentation

## 2018-02-25 DIAGNOSIS — Z131 Encounter for screening for diabetes mellitus: Secondary | ICD-10-CM | POA: Diagnosis not present

## 2018-02-25 DIAGNOSIS — Z5181 Encounter for therapeutic drug level monitoring: Secondary | ICD-10-CM

## 2018-02-25 MED ORDER — AMLODIPINE BESYLATE 5 MG PO TABS: 5.0000 mg | ORAL_TABLET | Freq: Every day | ORAL | 1 refills | Status: DC

## 2018-02-25 NOTE — Progress Notes (Signed)
HPI:                                                                Randy Thompson is a 42 y.o. male who presents to [facility]: Primary Care Sports Medicine today for annual physical exam  Requesting medication refills  HTN: taking Amlodipine 5mg  daily. Compliant with medications. Does not check BP's at home.  Denies vision change, headache, chest pain with exertion, orthopnea, lightheadedness, syncope and edema. Risk factors include: HLD, obesity, male sex  HLD: taking Pravastatin 20 mg daily. Compliant with medications. Denies myalgias.  Depression screen [facility] 2/9 02/25/2018 05/10/2017  Decreased Interest 0 0  Down, Depressed, Hopeless 0 0  PHQ - 2 Score 0 0    No flowsheet data found.    Past Medical History:  Diagnosis Date  . Allergy   . Asthma    childhood  . Chronic chest pain 05/10/2017  . Chronic low back pain   . Hyperlipidemia   . Hypertension   . 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 Morbid obesity (HCC) 01/26/2014  . Nocturnal leg cramps 05/10/2017  . Prediabetes 05/10/2017   History reviewed. No pertinent surgical history. Social History   Tobacco Use  . Smoking status: Former Smoker    Types: Cigarettes    Last attempt to quit: 03/22/1997    Years since quitting: 20.9  . Smokeless tobacco: Never Used  Substance Use Topics  . Alcohol use: Yes   family history includes Asthma in his father; Cancer in his mother; Emphysema in his father; Hypertension in his father.    ROS: negative except as noted in the HPI  Medications: Current Outpatient Medications  Medication Sig Dispense Refill  . amLODipine (NORVASC) 5 MG tablet Take 1 tablet (5 mg total) by mouth daily. 90 tablet 1  . Melatonin 5 MG CAPS Take 5 mg by mouth daily as needed.    . meloxicam (MOBIC) 15 MG tablet One tab PO qAM with breakfast for 2 weeks, then daily prn pain. 30 tablet 3  . pravastatin (PRAVACHOL) 20 MG tablet TAKE 1 TABLET BY MOUTH EVERY DAY. Must keep  follow up. 30 tablet 0   No current facility-administered medications for this visit.    Allergies  Allergen Reactions  . Penicillins Rash       Objective:  BP 113/76   Pulse 96   Wt 291 lb (132 kg)   BMI 38.39 kg/m  General Appearance:  Alert, cooperative, no distress, appropriate for age, obese male                            Head:  Normocephalic, without obvious abnormality                             Eyes:  PERRL, EOM's intact, conjunctiva and cornea clear                             Ears:  TM pearly gray color and semitransparent, external ear canals normal, both ears  Nose:  Nares symmetrical                          Throat:  Lips, tongue, and mucosa are moist, pink, and intact; good dentition                             Neck:  Supple; symmetrical, trachea midline, no adenopathy; thyroid: no enlargement, symmetric, no tenderness/mass/nodules                             Back:  Symmetrical, no curvature, ROM normal                                         Lungs:  Clear to auscultation bilaterally, respirations unlabored                             Heart:  regular rate & normal rhythm, S1 and S2 normal, no murmurs, rubs, or gallops                     Abdomen:  Soft, non-tender, no mass or organomegaly              Genitourinary:  deferred         Musculoskeletal:  Tone and strength strong and symmetrical, all extremities; no joint pain or edema, normal gait and station                                   Lymphatic:  No adenopathy             Skin/Hair/Nails:  Skin warm, dry and intact, no rashes or abnormal dyspigmentation on limited exam                   Neurologic:  Alert and oriented x3, no cranial nerve deficits, sensation grossly intact, normal gait and station, no tremor Psych: well-groomed, cooperative, good eye contact, euthymic mood, affect mood-congruent, speech is articulate, and thought processes clear and goal-directed   No results found  for this or any previous visit (from the past 72 hour(s)). No results found.    Assessment and Plan: 42 y.o. male with   Encounter for annual physical exam - Plan: CBC, Comprehensive metabolic panel, Lipid Panel w/reflex Direct LDL  Hypertension goal BP (blood pressure) < 130/80 - Plan: amLODipine (NORVASC) 5 MG tablet, Comprehensive metabolic panel  Hyperlipidemia LDL goal <100 - Plan: Lipid Panel w/reflex Direct LDL  Other tobacco product nicotine dependence, uncomplicated  Encounter for monitoring statin therapy - Plan: CBC, Comprehensive metabolic panel, Lipid Panel w/reflex Direct LDL  Class 2 severe obesity due to excess calories with serious comorbidity in adult, unspecified BMI (HCC) - Plan: Hemoglobin A1c  Screening for diabetes mellitus - Plan: Hemoglobin A1c  Prediabetes - Plan: Hemoglobin A1c   - Personally reviewed PMH, PSH, PFH, medications, allergies, HM - Age-appropriate cancer screening: n/a - Influenza n/a - Tdap UTD per patient - PHQ2 negative  HTN BP Readings from Last 3 Encounters:  02/25/18 113/76  06/17/17 112/77  05/20/17 125/81  - BP in range - cont Amlodipine 5 mg - counseled on therapeutic lifestyle  changes  Obesity, Class 2 - counseled on weight loss through decreased caloric intake and increased aerobic exercise - recommended 2200 calorie moderate protein DASH diet  Patient education and anticipatory guidance given Patient agrees with treatment plan Follow-up in 6 months for medication mgmt or sooner as needed if symptoms worsen or fail to improve  Levonne Hubertharley E. Ida Uppal PA-C

## 2018-02-25 NOTE — Patient Instructions (Addendum)
For your blood pressure: - Goal <130/80 - continue Amlodipine 5 mg daily - monitor and log blood pressures at home - check around the same time each day in a relaxed setting - Limit salt to <2000 mg/day - Follow DASH eating plan - limit alcohol to 2 standard drinks per day for men and 1 per day for women - avoid tobacco products - weight loss: 7% of current body weight - follow-up every 6 months for your blood pressure   For weight: - 2200 calorie moderate protein diet - 30-35% calories from protein - 30% calories from fat - 35-40% from carbs: If diabetic, limit carbs to 45 g per meal and 20 g per snack - MEASURE YOUR CALORIES (If you don't measure, you don't know if you are over-eating) - eating plans that are good options for people with high blood pressure are DASH, Mediterranean, and ADA (diabetes) - drink 11 glasses of water per day - limit caffeine to 1 cup per day - watch your sugar!  - no soda, sweet tea, juice, energy drinks, gatorade, or sweetened beverages - research the following medication options: Qsymia, Belviq, Contrave and Saxenda (if interested in medication to help lose weight) Engage in aerobic physical activity to reduce LDL-cholesterol, non-HDL-cholesterol, and blood pressure  Frequency: 3-4 sessions per week  Intensity: moderate to vigorous  Duration: 40 minutes on average  DASH Eating Plan DASH stands for "Dietary Approaches to Stop Hypertension." The DASH eating plan is a healthy eating plan that has been shown to reduce high blood pressure (hypertension). It may also reduce your risk for type 2 diabetes, heart disease, and stroke. The DASH eating plan may also help with weight loss. What are tips for following this plan? General guidelines  Avoid eating more than 2,300 mg (milligrams) of salt (sodium) a day. If you have hypertension, you may need to reduce your sodium intake to 1,500 mg a day.  Limit alcohol intake to no more than 1 drink a day for  nonpregnant women and 2 drinks a day for men. One drink equals 12 oz of beer, 5 oz of wine, or 1 oz of hard liquor.  Work with your health care provider to maintain a healthy body weight or to lose weight. Ask what an ideal weight is for you.  Get at least 30 minutes of exercise that causes your heart to beat faster (aerobic exercise) most days of the week. Activities may include walking, swimming, or biking.  Work with your health care provider or diet and nutrition specialist (dietitian) to adjust your eating plan to your individual calorie needs. Reading food labels  Check food labels for the amount of sodium per serving. Choose foods with less than 5 percent of the Daily Value of sodium. Generally, foods with less than 300 mg of sodium per serving fit into this eating plan.  To find whole grains, look for the word "whole" as the first word in the ingredient list. Shopping  Buy products labeled as "low-sodium" or "no salt added."  Buy fresh foods. Avoid canned foods and premade or frozen meals. Cooking  Avoid adding salt when cooking. Use salt-free seasonings or herbs instead of table salt or sea salt. Check with your health care provider or pharmacist before using salt substitutes.  Do not fry foods. Cook foods using healthy methods such as baking, boiling, grilling, and broiling instead.  Cook with heart-healthy oils, such as olive, canola, soybean, or sunflower oil. Meal planning   Eat a balanced diet  that includes: ? 5 or more servings of fruits and vegetables each day. At each meal, try to fill half of your plate with fruits and vegetables. ? Up to 6-8 servings of whole grains each day. ? Less than 6 oz of lean meat, poultry, or fish each day. A 3-oz serving of meat is about the same size as a deck of cards. One egg equals 1 oz. ? 2 servings of low-fat dairy each day. ? A serving of nuts, seeds, or beans 5 times each week. ? Heart-healthy fats. Healthy fats called Omega-3  fatty acids are found in foods such as flaxseeds and coldwater fish, like sardines, salmon, and mackerel.  Limit how much you eat of the following: ? Canned or prepackaged foods. ? Food that is high in trans fat, such as fried foods. ? Food that is high in saturated fat, such as fatty meat. ? Sweets, desserts, sugary drinks, and other foods with added sugar. ? Full-fat dairy products.  Do not salt foods before eating.  Try to eat at least 2 vegetarian meals each week.  Eat more home-cooked food and less restaurant, buffet, and fast food.  When eating at a restaurant, ask that your food be prepared with less salt or no salt, if possible. What foods are recommended? The items listed may not be a complete list. Talk with your dietitian about what dietary choices are best for you. Grains Whole-grain or whole-wheat bread. Whole-grain or whole-wheat pasta. Brown rice. Modena Morrow. Bulgur. Whole-grain and low-sodium cereals. Pita bread. Low-fat, low-sodium crackers. Whole-wheat flour tortillas. Vegetables Fresh or frozen vegetables (raw, steamed, roasted, or grilled). Low-sodium or reduced-sodium tomato and vegetable juice. Low-sodium or reduced-sodium tomato sauce and tomato paste. Low-sodium or reduced-sodium canned vegetables. Fruits All fresh, dried, or frozen fruit. Canned fruit in natural juice (without added sugar). Meat and other protein foods Skinless chicken or [place]. Ground chicken or [place]. Pork with fat trimmed off. Fish and seafood. Egg whites. Dried beans, peas, or lentils. Unsalted nuts, nut butters, and seeds. Unsalted canned beans. Lean cuts of beef with fat trimmed off. Low-sodium, lean deli meat. Dairy Low-fat (1%) or fat-free (skim) milk. Fat-free, low-fat, or reduced-fat cheeses. Nonfat, low-sodium ricotta or cottage cheese. Low-fat or nonfat yogurt. Low-fat, low-sodium cheese. Fats and oils Soft margarine without trans fats. Vegetable oil. Low-fat, reduced-fat, or  light mayonnaise and salad dressings (reduced-sodium). Canola, safflower, olive, soybean, and sunflower oils. Avocado. Seasoning and other foods Herbs. Spices. Seasoning mixes without salt. Unsalted popcorn and pretzels. Fat-free sweets. What foods are not recommended? The items listed may not be a complete list. Talk with your dietitian about what dietary choices are best for you. Grains Baked goods made with fat, such as croissants, muffins, or some breads. Dry pasta or rice meal packs. Vegetables Creamed or fried vegetables. Vegetables in a cheese sauce. Regular canned vegetables (not low-sodium or reduced-sodium). Regular canned tomato sauce and paste (not low-sodium or reduced-sodium). Regular tomato and vegetable juice (not low-sodium or reduced-sodium). Angie Fava. Olives. Fruits Canned fruit in a light or heavy syrup. Fried fruit. Fruit in cream or butter sauce. Meat and other protein foods Fatty cuts of meat. Ribs. Fried meat. Berniece Salines. Sausage. Bologna and other processed lunch meats. Salami. Fatback. Hotdogs. Bratwurst. Salted nuts and seeds. Canned beans with added salt. Canned or smoked fish. Whole eggs or egg yolks. Chicken or [place] with skin. Dairy Whole or 2% milk, cream, and half-and-half. Whole or full-fat cream cheese. Whole-fat or sweetened yogurt. Full-fat cheese. Nondairy creamers.  Whipped toppings. Processed cheese and cheese spreads. Fats and oils Butter. Stick margarine. Lard. Shortening. Ghee. Bacon fat. Tropical oils, such as coconut, palm kernel, or palm oil. Seasoning and other foods Salted popcorn and pretzels. Onion salt, garlic salt, seasoned salt, table salt, and sea salt. Worcestershire sauce. Tartar sauce. Barbecue sauce. Teriyaki sauce. Soy sauce, including reduced-sodium. Steak sauce. Canned and packaged gravies. Fish sauce. Oyster sauce. Cocktail sauce. Horseradish that you find on the shelf. Ketchup. Mustard. Meat flavorings and tenderizers. Bouillon cubes. Hot  sauce and Tabasco sauce. Premade or packaged marinades. Premade or packaged taco seasonings. Relishes. Regular salad dressings. Where to find more information:  National Heart, Lung, and Blood Institute: PopSteam.iswww.nhlbi.nih.gov  American Heart Association: www.heart.org Summary  The DASH eating plan is a healthy eating plan that has been shown to reduce high blood pressure (hypertension). It may also reduce your risk for type 2 diabetes, heart disease, and stroke.  With the DASH eating plan, you should limit salt (sodium) intake to 2,300 mg a day. If you have hypertension, you may need to reduce your sodium intake to 1,500 mg a day.  When on the DASH eating plan, aim to eat more fresh fruits and vegetables, whole grains, lean proteins, low-fat dairy, and heart-healthy fats.  Work with your health care provider or diet and nutrition specialist (dietitian) to adjust your eating plan to your individual calorie needs. This information is not intended to replace advice given to you by your health care provider. Make sure you discuss any questions you have with your health care provider. Document Released: 08/16/2011 Document Revised: 08/20/2016 Document Reviewed: 08/20/2016 Elsevier Interactive Patient Education  [employer]2018 Elsevier Inc.

## 2018-02-26 ENCOUNTER — Other Ambulatory Visit: Payer: Self-pay | Admitting: Physician Assistant

## 2018-02-26 DIAGNOSIS — E785 Hyperlipidemia, unspecified: Secondary | ICD-10-CM

## 2018-02-26 MED ORDER — PRAVASTATIN SODIUM 40 MG PO TABS: 40.0000 mg | ORAL_TABLET | Freq: Every day | ORAL | 1 refills | Status: DC

## 2018-02-26 NOTE — Progress Notes (Signed)
Good afternoon,  Labs show a slight decrease in your kidney function. Recommend drinking plenty of water, avoiding anti-inflammatories like Ibuprofen and Aleve, and rechecking kidney function in 2 weeks  LDL cholesterol is 121. Ideally we want this below 100 while taking a statin. I am increasing your Pravastatin to 40 mg. Additionally recommend following a Mediterranean diet, regular aerobic exercise and losing 5% of current body weight.  Best, Vinetta Bergamoharley

## 2018-03-05 ENCOUNTER — Ambulatory Visit: Payer: Commercial Managed Care - PPO | Admitting: Physician Assistant

## 2018-06-10 LAB — COMPREHENSIVE METABOLIC PANEL
AG Ratio: 1.9 (calc) (ref 1.0–2.5)
ALBUMIN MSPROF: 5 g/dL (ref 3.6–5.1)
ALKALINE PHOSPHATASE (APISO): 64 U/L (ref 40–115)
ALT: 28 U/L (ref 9–46)
AST: 24 U/L (ref 10–40)
BILIRUBIN TOTAL: 0.3 mg/dL (ref 0.2–1.2)
BUN/Creatinine Ratio: 10 (calc) (ref 6–22)
BUN: 14 mg/dL (ref 7–25)
CALCIUM: 10.1 mg/dL (ref 8.6–10.3)
CO2: 26 mmol/L (ref 20–32)
Chloride: 104 mmol/L (ref 98–110)
Creat: 1.41 mg/dL — ABNORMAL HIGH (ref 0.60–1.35)
Globulin: 2.7 g/dL (calc) (ref 1.9–3.7)
Glucose, Bld: 89 mg/dL (ref 65–99)
Potassium: 4.5 mmol/L (ref 3.5–5.3)
Sodium: 139 mmol/L (ref 135–146)
Total Protein: 7.7 g/dL (ref 6.1–8.1)

## 2018-06-10 LAB — CBC
HEMATOCRIT: 45.8 % (ref 38.5–50.0)
Hemoglobin: 15.8 g/dL (ref 13.2–17.1)
MCH: 29 pg (ref 27.0–33.0)
MCHC: 34.5 g/dL (ref 32.0–36.0)
MCV: 84.2 fL (ref 80.0–100.0)
MPV: 10.4 fL (ref 7.5–12.5)
PLATELETS: 268 10*3/uL (ref 140–400)
RBC: 5.44 10*6/uL (ref 4.20–5.80)
RDW: 14.8 % (ref 11.0–15.0)
WBC: 7.3 10*3/uL (ref 3.8–10.8)

## 2018-06-10 LAB — LIPID PANEL W/REFLEX DIRECT LDL
CHOL/HDL RATIO: 4.7 (calc) (ref <5.0)
CHOLESTEROL: 186 mg/dL (ref <200)
HDL: 40 mg/dL — AB (ref >40)
LDL CHOLESTEROL (CALC): 121 mg/dL — AB
Non-HDL Cholesterol (Calc): 146 mg/dL (calc) — ABNORMAL HIGH (ref <130)
TRIGLYCERIDES: 139 mg/dL (ref <150)

## 2018-06-10 LAB — HEMOGLOBIN A1C W/OUT EAG: Hgb A1c MFr Bld: 5.6 % of total Hgb (ref <5.7)

## 2018-08-12 ENCOUNTER — Ambulatory Visit: Payer: Commercial Managed Care - PPO | Admitting: Sports Medicine

## 2018-08-12 ENCOUNTER — Encounter: Payer: Self-pay | Admitting: Sports Medicine

## 2018-08-12 DIAGNOSIS — J0140 Acute pansinusitis, unspecified: Secondary | ICD-10-CM | POA: Insufficient documentation

## 2018-08-12 MED ORDER — FLUTICASONE PROPIONATE 50 MCG/ACT NA SUSP
NASAL | 3 refills | Status: DC
Start: 2018-08-12 — End: 2022-06-28

## 2018-08-12 MED ORDER — AZITHROMYCIN 250 MG PO TABS: ORAL_TABLET | ORAL | 0 refills | Status: DC

## 2018-08-12 NOTE — Assessment & Plan Note (Signed)
Frontal and maxillary pain with radiation to the teeth, mild systemic symptoms. Clear cardiopulmonary exam. Penicillin allergic, using azithromycin, Flonase.

## 2018-08-12 NOTE — Progress Notes (Signed)
Subjective:    CC: Feeling sick  HPI: This is a pleasant 42 year old male, for the past week to week and a half he has had pain and pressure behind his frontal and maxillary sinuses with radiation to his teeth, malaise, minimal sore throat with mild nonproductive cough, no fevers, no chills.  Bad taste in his mouth, purulent drainage from the nose.  No headaches, visual changes, chest pain.  No shortness of breath.  No leg swelling.  No GI symptoms or rash.  I reviewed the past medical history, family history, social history, surgical history, and allergies today and no changes were needed.  Please see the problem list section below in epic for further details.  Past Medical History: Past Medical History:  Diagnosis Date  . Allergy   . Asthma    childhood  . Chronic chest pain 05/10/2017  . Chronic low back pain   . Hyperlipidemia   . Hypertension   . 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 Morbid obesity (HCC) 01/26/2014  . Nocturnal leg cramps 05/10/2017  . Prediabetes 05/10/2017   Past Surgical History: No past surgical history on file. Social History: Social History   Socioeconomic History  . Marital status: Married    Spouse name: Not on file  . Number of children: Not on file  . Years of education: Not on file  . Highest education level: Not on file  Occupational History  . Not on file  Social Needs  . Financial resource strain: Not on file  . Food insecurity:    Worry: Not on file    Inability: Not on file  . Transportation needs:    Medical: Not on file    Non-medical: Not on file  Tobacco Use  . Smoking status: Former Smoker    Types: Cigarettes    Last attempt to quit: 03/22/1997    Years since quitting: 21.4  . Smokeless tobacco: Never Used  Substance and Sexual Activity  . Alcohol use: Yes  . Drug use: No  . Sexual activity: Yes  Lifestyle  . Physical activity:    Days per week: Not on file    Minutes per session: Not on file  . Stress: Not on  file  Relationships  . Social connections:    Talks on phone: Not on file    Gets together: Not on file    Attends religious service: Not on file    Active member of club or organization: Not on file    Attends meetings of clubs or organizations: Not on file    Relationship status: Not on file  Other Topics Concern  . Not on file  Social History Narrative  . Not on file   Family History: Family History  Problem Relation Age of Onset  . Cancer Mother   . Asthma Father   . Hypertension Father   . Emphysema Father   . Colon cancer Neg Hx   . Prostate cancer Neg Hx   . Heart attack Neg Hx   . Stroke Neg Hx    Allergies: Allergies  Allergen Reactions  . Penicillins Rash   Medications: See med rec.  Review of Systems: No fevers, chills, night sweats, weight loss, chest pain, or shortness of breath.   Objective:    General: Well Developed, well nourished, and in no acute distress.  Neuro: Alert and oriented x3, extra-ocular muscles intact, sensation grossly intact.  HEENT: Normocephalic, atraumatic, pupils equal round reactive to light, neck supple, no masses, no  lymphadenopathy, thyroid nonpalpable.  Oropharynx is erythematous, nasopharynx, ear canals unremarkable, minimal tenderness over the frontal and maxillary sinuses. Skin: Warm and dry, no rashes. Cardiac: Regular rate and rhythm, no murmurs rubs or gallops, no lower extremity edema.  Respiratory: Clear to auscultation bilaterally. Not using accessory muscles, speaking in full sentences.  Impression and Recommendations:    Acute pansinusitis Frontal and maxillary pain with radiation to the teeth, mild systemic symptoms. Clear cardiopulmonary exam. Penicillin allergic, using azithromycin, Flonase. ___________________________________________ Ihor Austinhomas J. Benjamin Stainhekkekandam, M.D., ABFM., CAQSM. Primary Care and Sports Medicine [facility]  Adjunct Professor of Family Medicine  [employer]

## 2018-08-12 NOTE — Patient Instructions (Signed)

## 2018-08-18 ENCOUNTER — Encounter: Payer: Self-pay | Admitting: Physician Assistant

## 2018-08-18 DIAGNOSIS — R7989 Other specified abnormal findings of blood chemistry: Secondary | ICD-10-CM

## 2018-08-18 DIAGNOSIS — I10 Essential (primary) hypertension: Secondary | ICD-10-CM

## 2018-08-25 ENCOUNTER — Ambulatory Visit: Payer: Commercial Managed Care - PPO | Admitting: Physician Assistant

## 2018-08-27 ENCOUNTER — Ambulatory Visit: Payer: Commercial Managed Care - PPO | Admitting: Physician Assistant

## 2018-12-08 ENCOUNTER — Other Ambulatory Visit: Payer: Self-pay | Admitting: Physician Assistant

## 2018-12-08 DIAGNOSIS — E785 Hyperlipidemia, unspecified: Secondary | ICD-10-CM

## 2018-12-08 DIAGNOSIS — I10 Essential (primary) hypertension: Secondary | ICD-10-CM

## 2019-05-03 ENCOUNTER — Other Ambulatory Visit: Payer: Self-pay | Admitting: Physician Assistant

## 2019-05-03 DIAGNOSIS — I10 Essential (primary) hypertension: Secondary | ICD-10-CM

## 2019-05-03 DIAGNOSIS — E785 Hyperlipidemia, unspecified: Secondary | ICD-10-CM

## 2019-06-18 ENCOUNTER — Other Ambulatory Visit: Payer: Self-pay | Admitting: Physician Assistant

## 2019-06-18 DIAGNOSIS — E785 Hyperlipidemia, unspecified: Secondary | ICD-10-CM

## 2019-06-18 DIAGNOSIS — I10 Essential (primary) hypertension: Secondary | ICD-10-CM

## 2019-07-07 ENCOUNTER — Encounter: Payer: Self-pay | Admitting: Family Medicine

## 2019-07-07 ENCOUNTER — Ambulatory Visit (INDEPENDENT_AMBULATORY_CARE_PROVIDER_SITE_OTHER): Payer: Commercial Managed Care - PPO | Admitting: Family Medicine

## 2019-07-07 ENCOUNTER — Other Ambulatory Visit: Payer: Self-pay

## 2019-07-07 VITALS — BP 139/94 | HR 92 | Temp 98.4°F | Wt 300.0 lb

## 2019-07-07 DIAGNOSIS — E785 Hyperlipidemia, unspecified: Secondary | ICD-10-CM | POA: Diagnosis not present

## 2019-07-07 DIAGNOSIS — I10 Essential (primary) hypertension: Secondary | ICD-10-CM | POA: Diagnosis not present

## 2019-07-07 DIAGNOSIS — Z5181 Encounter for therapeutic drug level monitoring: Secondary | ICD-10-CM

## 2019-07-07 DIAGNOSIS — Z79899 Other long term (current) drug therapy: Secondary | ICD-10-CM

## 2019-07-07 DIAGNOSIS — Z0000 Encounter for general adult medical examination without abnormal findings: Secondary | ICD-10-CM | POA: Diagnosis not present

## 2019-07-07 DIAGNOSIS — Z6839 Body mass index (BMI) 39.0-39.9, adult: Secondary | ICD-10-CM

## 2019-07-07 MED ORDER — AMLODIPINE BESYLATE 5 MG PO TABS: 5.0000 mg | ORAL_TABLET | Freq: Every day | ORAL | 3 refills | Status: DC

## 2019-07-07 MED ORDER — PRAVASTATIN SODIUM 40 MG PO TABS: 40.0000 mg | ORAL_TABLET | Freq: Every day | ORAL | 3 refills | Status: DC

## 2019-07-07 NOTE — Progress Notes (Signed)
Randy Thompson is a 43 y.o. male who presents to [facility]: [facility] today for well adult visit.   Randy Thompson is doing reasonably well overall.  He is working to try to lose weight by reducing his calories and is try to exercise some.  He has a history of hypertension and hyperlipidemia managed with amlodipine and pravastatin.  He has been out of these medicines for a few days.  He notes that he was able to quit smoking about a year and a half ago.  He does not drink heavily.   ROS as above:  Past Medical History:  Diagnosis Date  . Allergy   . Asthma    childhood  . Chronic chest pain 05/10/2017  . Chronic low back pain   . Hyperlipidemia   . Hypertension   . 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 Morbid obesity ([place]) 01/26/2014  . Nocturnal leg cramps 05/10/2017  . Prediabetes 05/10/2017   No past surgical history on file. Social History   Tobacco Use  . Smoking status: Former Smoker    Types: Cigarettes    Quit date: 03/22/1997    Years since quitting: 22.3  . Smokeless tobacco: Never Used  Substance Use Topics  . Alcohol use: Yes   family history includes Asthma in his father; Cancer (age of onset: 72) in his mother; Cirrhosis in his father; Emphysema in his father; Hypertension in his father.  Medications: Current Outpatient Medications  Medication Sig Dispense Refill  . amLODipine (NORVASC) 5 MG tablet Take 1 tablet (5 mg total) by mouth daily. 90 tablet 3  . fluticasone (FLONASE) 50 MCG/ACT nasal spray One spray in each nostril twice a day, use left hand for right nostril, and right hand for left nostril. 48 g 3  . Melatonin 5 MG CAPS Take 5 mg by mouth daily as needed.    . meloxicam (MOBIC) 15 MG tablet One tab PO qAM with breakfast for 2 weeks, then daily prn pain. 30 tablet 3  . pravastatin (PRAVACHOL) 40 MG tablet Take 1 tablet (40 mg total) by mouth  daily. Due for follow up visit w/PC 90 tablet 3   No current facility-administered medications for this visit.    Allergies  Allergen Reactions  . Penicillins Rash    Health Maintenance Health Maintenance  Topic Date Due  . HIV Screening  03/22/1993  . INFLUENZA VACCINE  12/09/2019 (Originally 04/11/2019)  . TETANUS/TDAP  02/26/2023     Exam:  BP (!) 139/94   Pulse 92   Temp 98.4 F (36.9 C) (Oral)   Wt 300 lb (136.1 kg)   BMI 39.58 kg/m  Wt Readings from Last 5 Encounters:  07/07/19 300 lb (136.1 kg)  02/25/18 291 lb (132 kg)  06/17/17 288 lb (130.6 kg)  05/20/17 288 lb (130.6 kg)  05/10/17 283 lb 8 oz (128.6 kg)      Gen: Well NAD HEENT: EOMI,  MMM Lungs: Normal work of breathing. CTABL Heart: RRR no MRG Abd: NABS, Soft. Nondistended, Nontender Exts: Brisk capillary refill, warm and well perfused.  Psych: Alert and oriented normal speech thought process and affect.  Depression screen [facility] 2/9 07/07/2019 02/25/2018 05/10/2017  Decreased Interest 2 0 0  Down, Depressed, Hopeless 0 0 0  PHQ - 2 Score 2 0 0  Altered sleeping 2 - -  Tired, decreased energy 2 - -  Change in appetite 0 - -  Feeling bad or  failure about yourself  0 - -  Trouble concentrating 0 - -  Moving slowly or fidgety/restless 0 - -  Suicidal thoughts 0 - -  PHQ-9 Score 6 - -  Difficult doing work/chores Not difficult at all - -      Assessment and Plan: 43 y.o. male with  Well adult.  Doing reasonably well.  Restart amlodipine and pravastatin.  Check metabolic panel direct LDL and CBC as patient is not currently fasting.  Work on weight loss with diet and exercise strategies.  Recheck yearly if all is well. PDMP not reviewed this encounter. Orders Placed This Encounter  Procedures  . COMPLETE METABOLIC PANEL WITH GFR  . LDL cholesterol, direct  . CBC   Meds ordered this encounter  Medications  . amLODipine (NORVASC) 5 MG tablet    Sig: Take 1 tablet (5 mg total) by mouth  daily.    Dispense:  90 tablet    Refill:  3  . pravastatin (PRAVACHOL) 40 MG tablet    Sig: Take 1 tablet (40 mg total) by mouth daily. Due for follow up visit w/PC    Dispense:  90 tablet    Refill:  3     Discussed warning signs or symptoms. Please see discharge instructions. Patient expresses understanding.

## 2019-07-07 NOTE — Patient Instructions (Signed)
Thank you for coming in today. Get labs now.  Continue medicine.  Work on weight loss.  Keep the calories low. A good goal is 2000 calories a day.  Consider myfitnesspal.

## 2019-07-08 LAB — COMPLETE METABOLIC PANEL WITH GFR
AG Ratio: 1.8 (calc) (ref 1.0–2.5)
ALT: 20 U/L (ref 9–46)
AST: 20 U/L (ref 10–40)
Albumin: 4.4 g/dL (ref 3.6–5.1)
Alkaline phosphatase (APISO): 53 U/L (ref 36–130)
BUN: 13 mg/dL (ref 7–25)
CO2: 25 mmol/L (ref 20–32)
Calcium: 9.6 mg/dL (ref 8.6–10.3)
Chloride: 107 mmol/L (ref 98–110)
Creat: 1.19 mg/dL (ref 0.60–1.35)
GFR, Est African American: 87 mL/min/{1.73_m2} (ref >=60)
GFR, Est Non African American: 75 mL/min/{1.73_m2} (ref >=60)
Globulin: 2.4 g/dL (calc) (ref 1.9–3.7)
Glucose, Bld: 85 mg/dL (ref 65–99)
Potassium: 4.6 mmol/L (ref 3.5–5.3)
Sodium: 140 mmol/L (ref 135–146)
Total Bilirubin: 0.3 mg/dL (ref 0.2–1.2)
Total Protein: 6.8 g/dL (ref 6.1–8.1)

## 2019-07-08 LAB — CBC
HCT: 44 % (ref 38.5–50.0)
Hemoglobin: 14.6 g/dL (ref 13.2–17.1)
MCH: 28.9 pg (ref 27.0–33.0)
MCHC: 33.2 g/dL (ref 32.0–36.0)
MCV: 87.1 fL (ref 80.0–100.0)
MPV: 10.8 fL (ref 7.5–12.5)
Platelets: 283 10*3/uL (ref 140–400)
RBC: 5.05 10*6/uL (ref 4.20–5.80)
RDW: 14.6 % (ref 11.0–15.0)
WBC: 7.2 10*3/uL (ref 3.8–10.8)

## 2019-07-08 LAB — LDL CHOLESTEROL, DIRECT: Direct LDL: 118 mg/dL — ABNORMAL HIGH (ref <100)

## 2019-07-08 NOTE — Progress Notes (Signed)
Cholesterol lab looks okay

## 2019-07-12 IMAGING — DX DG LUMBAR SPINE COMPLETE 4+V
5 series · 5 of 5 positions shown · non-contrast
Comparison: No prior.

CLINICAL DATA: Lumbar radiculitis.

EXAM:
LUMBAR SPINE - COMPLETE 4+ VIEW

[l-spine ap]
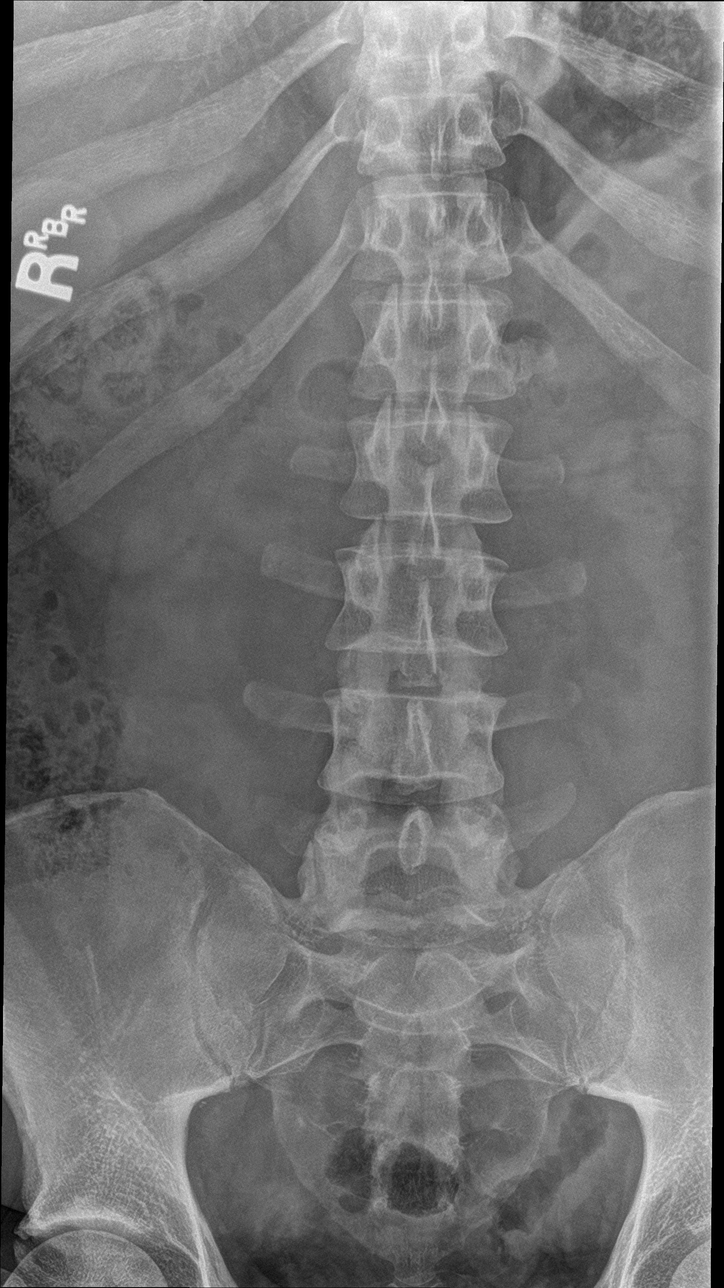

[l-spine obl (1 of 2)]
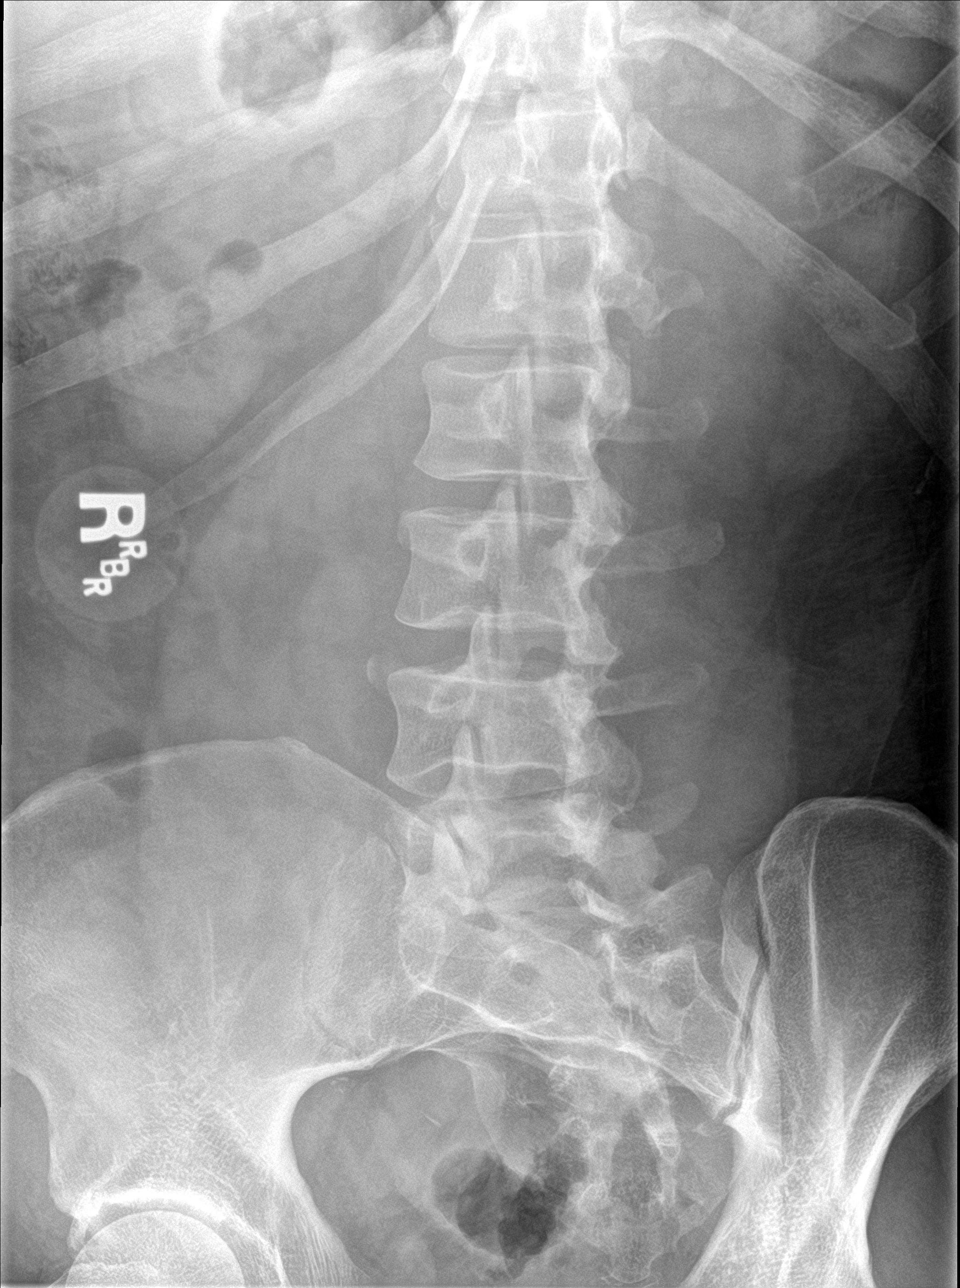

[l-spine obl (2 of 2)]
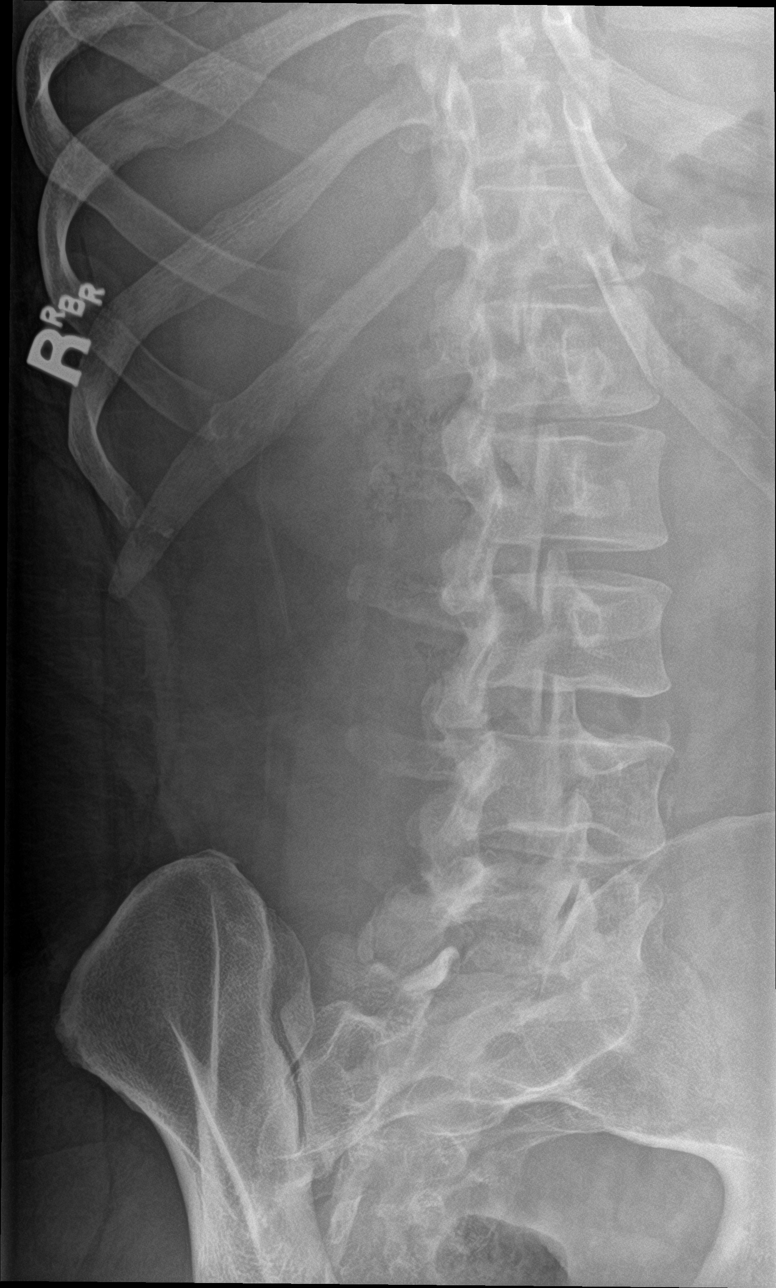

[l-spine lat]
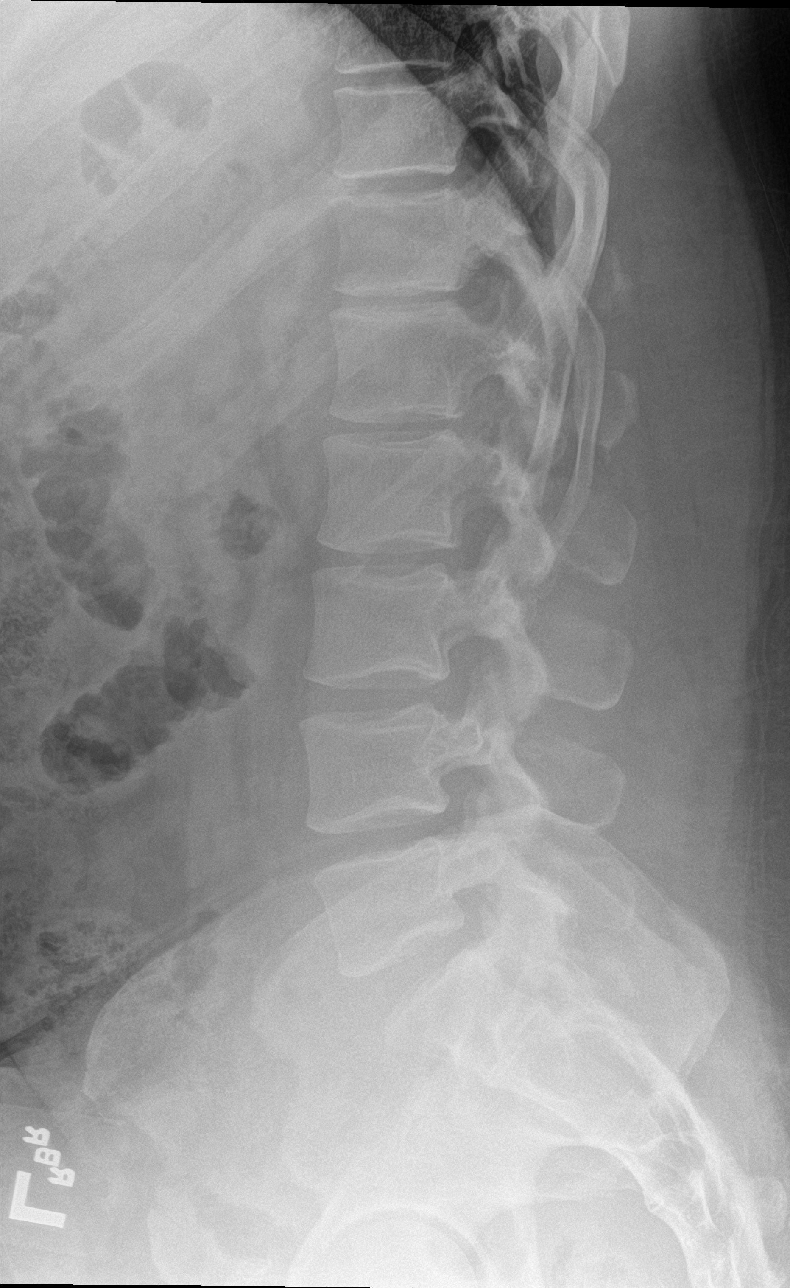

[l-spine spot]
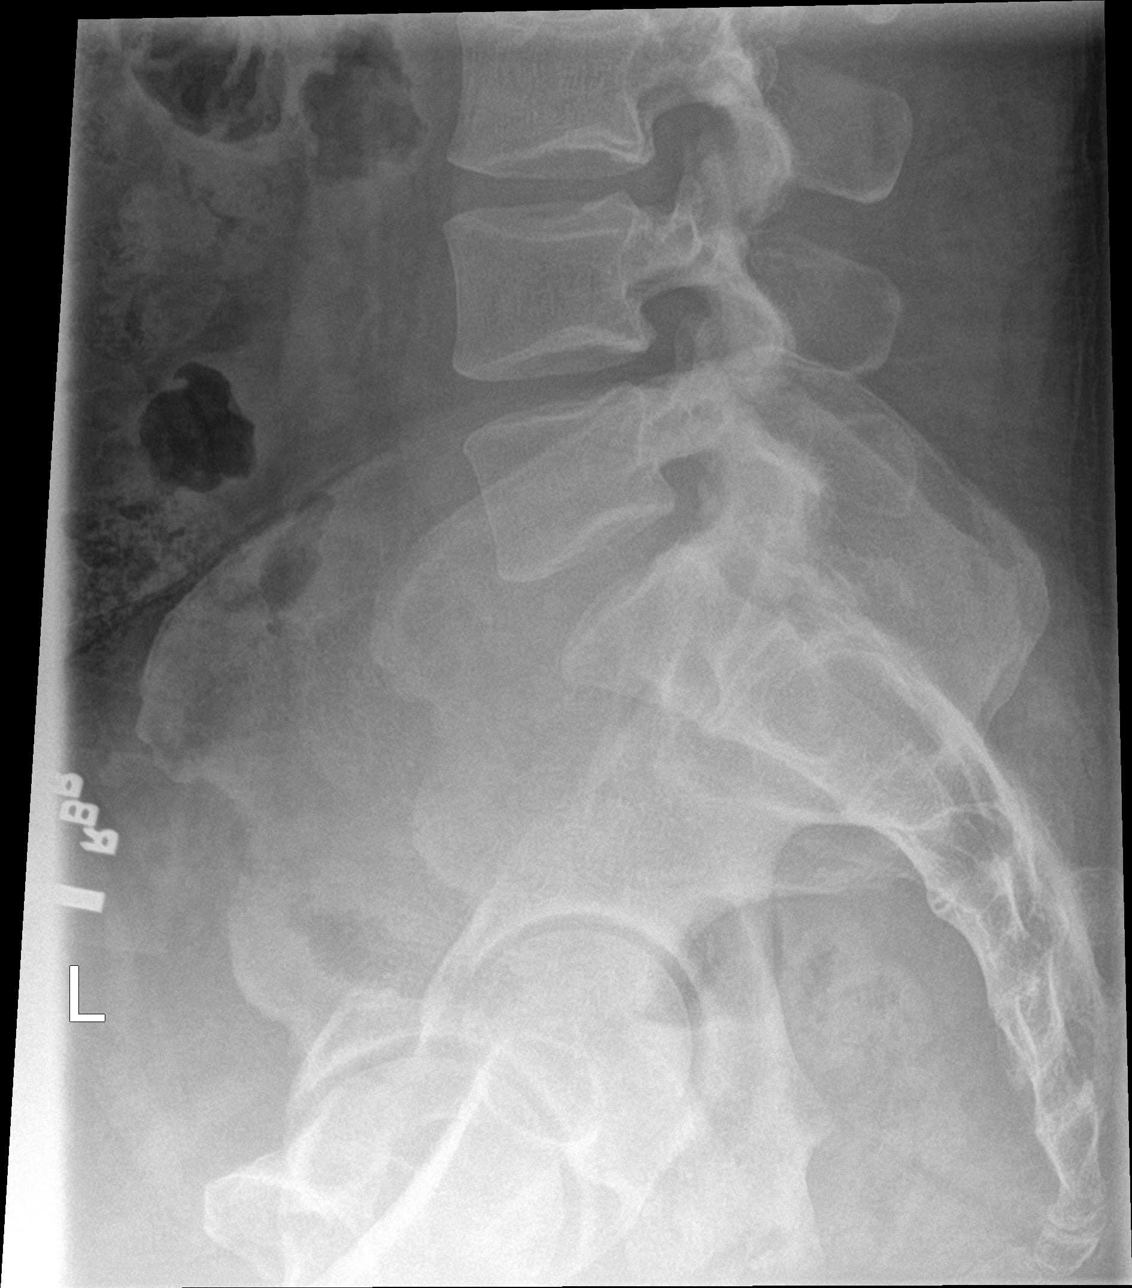

[5 of 5 positions shown; findings below may reference images not displayed]

FINDINGS: No acute bony abnormality identified. No evidence of fracture.
Normal alignment.
IMPRESSION: No acute or focal abnormality.

## 2020-08-15 ENCOUNTER — Other Ambulatory Visit: Payer: Self-pay | Admitting: Family Medicine

## 2020-08-15 NOTE — Telephone Encounter (Signed)
30 days sent Needs appt to establish care w/ Joy or Dr Ashley Royalty for authorization of further refills

## 2020-08-15 NOTE — Telephone Encounter (Signed)
Needs new PCP.

## 2020-08-17 NOTE — Telephone Encounter (Signed)
LVM for patient to call back to get appt scheduled. AM 

## 2020-08-22 ENCOUNTER — Ambulatory Visit: Payer: Commercial Managed Care - PPO | Admitting: Family Medicine

## 2020-09-19 ENCOUNTER — Ambulatory Visit (INDEPENDENT_AMBULATORY_CARE_PROVIDER_SITE_OTHER): Payer: Commercial Managed Care - PPO | Admitting: Family Medicine

## 2020-09-19 ENCOUNTER — Encounter: Payer: Self-pay | Admitting: Family Medicine

## 2020-09-19 VITALS — BP 143/100 | HR 103 | Wt 307.0 lb

## 2020-09-19 DIAGNOSIS — E785 Hyperlipidemia, unspecified: Secondary | ICD-10-CM

## 2020-09-19 DIAGNOSIS — R7303 Prediabetes: Secondary | ICD-10-CM

## 2020-09-19 DIAGNOSIS — Z0000 Encounter for general adult medical examination without abnormal findings: Secondary | ICD-10-CM | POA: Diagnosis not present

## 2020-09-19 DIAGNOSIS — I10 Essential (primary) hypertension: Secondary | ICD-10-CM

## 2020-09-19 MED ORDER — AMLODIPINE BESYLATE 10 MG PO TABS: 10.0000 mg | ORAL_TABLET | Freq: Every day | ORAL | 1 refills | Status: DC

## 2020-09-19 NOTE — Assessment & Plan Note (Signed)
Blood pressure elevated today, will increase amlodipine to 10 mg daily.  He is instructed to follow a low-sodium diet in addition to increasing the medication.

## 2020-09-19 NOTE — Progress Notes (Signed)
Randy Thompson - 45 y.o. male MRN [id number]  Date of birth: [birth year]  Subjective No chief complaint on file.   HPI Randy Thompson is a 45 y.o. male with history of HTN here today for annual exam.  His blood pressure is currently treated with amlodipine 5 mg daily.  He has been taking this as directed.  He denies any symptoms related to hypertension including chest pain, shortness of breath, palpitations, headaches or vision changes.  He is a former smoker.  He consumes alcohol occasionally on a social basis.  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  He feels like his diet is not too bad.  He reports that he did have COVID-vaccine however did not have booster.  He does not take flu shots.  ROS:  A comprehensive ROS was completed and negative except as noted per HPI  Allergies  Allergen Reactions  . Penicillins Rash    Past Medical History:  Diagnosis Date  . Allergy   . Asthma    childhood  . Chronic chest pain 05/10/2017  . Chronic low back pain   . Hyperlipidemia   . Hypertension   . 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 Morbid obesity (HCC) 01/26/2014  . Nocturnal leg cramps 05/10/2017  . Prediabetes 05/10/2017    History reviewed. No pertinent surgical history.  Social History   Socioeconomic History  . Marital status: Married    Spouse name: Not on file  . Number of children: Not on file  . Years of education: Not on file  . Highest education level: Not on file  Occupational History  . Not on file  Tobacco Use  . Smoking status: Current Every Day Smoker    Types: Cigarettes  . Smokeless tobacco: Never Used  Vaping Use  . Vaping Use: Every day  Substance and Sexual Activity  . Alcohol use: Yes  . Drug use: No  . Sexual activity: Yes  Other Topics Concern  . Not on file  Social History Narrative  . Not on file   Social Determinants of Health   Financial Resource Strain: Not on file  Food Insecurity: Not on file   Transportation Needs: Not on file  Physical Activity: Not on file  Stress: Not on file  Social Connections: Not on file    Family History  Problem Relation Age of Onset  . Cancer Mother 36       Lung  . Asthma Father   . Hypertension Father   . Emphysema Father   . Cirrhosis Father   . Colon cancer Neg Hx   . Prostate cancer Neg Hx   . Heart attack Neg Hx   . Stroke Neg Hx     Health Maintenance  Topic Date Due  . Hepatitis C Screening  Never done  . HIV Screening  Never done  . COVID-19 Vaccine (2 - Pfizer 2-dose series) 11/04/2019  . INFLUENZA VACCINE  12/08/2020 (Originally 04/10/2020)  . TETANUS/TDAP  06/10/2029     ----------------------------------------------------------------------------------------------------------------------------------------------------------------------------------------------------------------- Physical Exam BP (!) 143/100 (BP Location: Left Arm, Patient Position: Sitting, Cuff Size: Large)   Pulse (!) 103   Wt (!) 307 lb (139.3 kg)   SpO2 98%   BMI 40.50 kg/m   Physical Exam Constitutional:      General: He is not in acute distress.    Appearance: He is well-nourished.  HENT:     Head: Normocephalic and atraumatic.     Right  Ear: Tympanic membrane and external ear normal.     Left Ear: Tympanic membrane and external ear normal.     Mouth/Throat:     Mouth: Oropharynx is clear and moist.  Eyes:     General: No scleral icterus. Neck:     Thyroid: No thyromegaly.  Cardiovascular:     Rate and Rhythm: Normal rate and regular rhythm.     Pulses: Intact distal pulses.     Heart sounds: Normal heart sounds.  Pulmonary:     Effort: Pulmonary effort is normal.     Breath sounds: Normal breath sounds.  Abdominal:     General: Bowel sounds are normal. There is no distension.     Palpations: Abdomen is soft.     Tenderness: There is no abdominal tenderness. There is no guarding.  Musculoskeletal:        General: No edema.      Cervical back: Normal range of motion.  Lymphadenopathy:     Cervical: No cervical adenopathy.  Skin:    General: Skin is warm and dry.     Findings: No rash.  Neurological:     General: No focal deficit present.     Mental Status: He is alert and oriented to person, place, and time.     Cranial Nerves: No cranial nerve deficit.     Motor: No abnormal muscle tone.  Psychiatric:        Mood and Affect: Mood and affect normal.        Behavior: Behavior normal.     ------------------------------------------------------------------------------------------------------------------------------------------------------------------------------------------------------------------- Assessment and Plan  Hypertension goal BP (blood pressure) < 130/80 Blood pressure elevated today, will increase amlodipine to 10 mg daily.  He is instructed to follow a low-sodium diet in addition to increasing the medication.  Well adult exam Well adult Orders Placed This Encounter  Procedures  . COMPLETE METABOLIC PANEL WITH GFR  . CBC  . Lipid Profile  . HgB A1c  Screening: Lipid panel Immunizations: Declined flu vaccine Anticipatory guidance/risk factor reduction.  Counseled on control blood pressure as well as weight loss.  Additional recommendations per AVS.   Meds ordered this encounter  Medications  . amLODipine (NORVASC) 10 MG tablet    Sig: Take 1 tablet (10 mg total) by mouth daily.    Dispense:  90 tablet    Refill:  1    Return in about 4 weeks (around 10/17/2020) for HTN.    This visit occurred during the SARS-CoV-2 public health emergency.  Safety protocols were in place, including screening questions prior to the visit, additional usage of staff PPE, and extensive cleaning of exam room while observing appropriate contact time as indicated for disinfecting solutions.

## 2020-09-19 NOTE — Patient Instructions (Signed)
Nice to meet you today! Increase amlodipine to 10mg  daily Follow up in about 4 weeks to recheck blood pressure.    Preventive Care 17-43 Years Old, Male Preventive care refers to lifestyle choices and visits with your health care provider that can promote health and wellness. This includes:  A yearly physical exam. This is also called an annual wellness visit.  Regular dental and eye exams.  Immunizations.  Screening for certain conditions.  Healthy lifestyle choices, such as: ? Eating a healthy diet. ? Getting regular exercise. ? Not using drugs or products that contain nicotine and tobacco. ? Limiting alcohol use. What can I expect for my preventive care visit? Physical exam Your health care provider will check your:  Height and weight. These may be used to calculate your BMI (body mass index). BMI is a measurement that tells if you are at a healthy weight.  Heart rate and blood pressure.  Body temperature.  Skin for abnormal spots. Counseling Your health care provider may ask you questions about your:  Past medical problems.  Family's medical history.  Alcohol, tobacco, and drug use.  Emotional well-being.  Home life and relationship well-being.  Sexual activity.  Diet, exercise, and sleep habits.  Work and work 77.  Access to firearms. What immunizations do I need? Vaccines are usually given at various ages, according to a schedule. Your health care provider will recommend vaccines for you based on your age, medical history, and lifestyle or other factors, such as travel or where you work.   What tests do I need? Blood tests  Lipid and cholesterol levels. These may be checked every 5 years, or more often if you are over 2 years old.  Hepatitis C test.  Hepatitis B test. Screening  Lung cancer screening. You may have this screening every year starting at age 66 if you have a 30-pack-year history of smoking and currently smoke or have quit  within the past 15 years.  Prostate cancer screening. Recommendations will vary depending on your family history and other risks.  Genital exam to check for testicular cancer or hernias.  Colorectal cancer screening. ? All adults should have this screening starting at age 26 and continuing until age 6. ? Your health care provider may recommend screening at age 38 if you are at increased risk. ? You will have tests every 1-10 years, depending on your results and the type of screening test.  Diabetes screening. ? This is done by checking your blood sugar (glucose) after you have not eaten for a while (fasting). ? You may have this done every 1-3 years.  STD (sexually transmitted disease) testing, if you are at risk. Follow these instructions at home: Eating and drinking  Eat a diet that includes fresh fruits and vegetables, whole grains, lean protein, and low-fat dairy products.  Take vitamin and mineral supplements as recommended by your health care provider.  Do not drink alcohol if your health care provider tells you not to drink.  If you drink alcohol: ? Limit how much you have to 0-2 drinks a day. ? Be aware of how much alcohol is in your drink. In the [place] one drink equals one 12 oz bottle of beer (355 mL), one 5 oz glass of wine (148 mL), or one 1 oz glass of hard liquor (44 mL).   Lifestyle  Take daily care of your teeth and gums. Brush your teeth every morning and night with fluoride toothpaste. Floss one time each day.  Stay  active. Exercise for at least 30 minutes 5 or more days each week.  Do not use any products that contain nicotine or tobacco, such as cigarettes, e-cigarettes, and chewing tobacco. If you need help quitting, ask your health care provider.  Do not use drugs.  If you are sexually active, practice safe sex. Use a condom or other form of protection to prevent STIs (sexually transmitted infections).  If told by your health care provider, take  low-dose aspirin daily starting at age 66.  Find healthy ways to cope with stress, such as: ? Meditation, yoga, or listening to music. ? Journaling. ? Talking to a trusted person. ? Spending time with friends and family. Safety  Always wear your seat belt while driving or riding in a vehicle.  Do not drive: ? If you have been drinking alcohol. Do not ride with someone who has been drinking. ? When you are tired or distracted. ? While texting.  Wear a helmet and other protective equipment during sports activities.  If you have firearms in your house, make sure you follow all gun safety procedures. What's next?  Go to your health care provider once a year for an annual wellness visit.  Ask your health care provider how often you should have your eyes and teeth checked.  Stay up to date on all vaccines. This information is not intended to replace advice given to you by your health care provider. Make sure you discuss any questions you have with your health care provider. Document Revised: 05/26/2019 Document Reviewed: 08/21/2018 Elsevier Patient Education  2021 [employer].

## 2020-09-19 NOTE — Assessment & Plan Note (Signed)
Well adult Orders Placed This Encounter  Procedures  . COMPLETE METABOLIC PANEL WITH GFR  . CBC  . Lipid Profile  . HgB A1c  Screening: Lipid panel Immunizations: Declined flu vaccine Anticipatory guidance/risk factor reduction.  Counseled on control blood pressure as well as weight loss.  Additional recommendations per AVS.

## 2020-09-30 LAB — CBC
HCT: 49.8 % (ref 38.5–50.0)
Hemoglobin: 16.9 g/dL (ref 13.2–17.1)
MCH: 29.1 pg (ref 27.0–33.0)
MCHC: 33.9 g/dL (ref 32.0–36.0)
MCV: 85.7 fL (ref 80.0–100.0)
MPV: 10.2 fL (ref 7.5–12.5)
Platelets: 316 10*3/uL (ref 140–400)
RBC: 5.81 10*6/uL — ABNORMAL HIGH (ref 4.20–5.80)
RDW: 14.7 % (ref 11.0–15.0)
WBC: 7.8 10*3/uL (ref 3.8–10.8)

## 2020-09-30 LAB — COMPLETE METABOLIC PANEL WITH GFR
AG Ratio: 1.7 (calc) (ref 1.0–2.5)
ALT: 28 U/L (ref 9–46)
AST: 22 U/L (ref 10–40)
Albumin: 4.7 g/dL (ref 3.6–5.1)
Alkaline phosphatase (APISO): 67 U/L (ref 36–130)
BUN: 10 mg/dL (ref 7–25)
CO2: 27 mmol/L (ref 20–32)
Calcium: 9.4 mg/dL (ref 8.6–10.3)
Chloride: 106 mmol/L (ref 98–110)
Creat: 1.06 mg/dL (ref 0.60–1.35)
GFR, Est African American: 100 mL/min/{1.73_m2} (ref >=60)
GFR, Est Non African American: 86 mL/min/{1.73_m2} (ref >=60)
Globulin: 2.8 g/dL (calc) (ref 1.9–3.7)
Glucose, Bld: 91 mg/dL (ref 65–99)
Potassium: 4 mmol/L (ref 3.5–5.3)
Sodium: 141 mmol/L (ref 135–146)
Total Bilirubin: 0.3 mg/dL (ref 0.2–1.2)
Total Protein: 7.5 g/dL (ref 6.1–8.1)

## 2020-09-30 LAB — HEMOGLOBIN A1C
Hgb A1c MFr Bld: 5.9 % of total Hgb — ABNORMAL HIGH (ref <5.7)
Mean Plasma Glucose: 123 mg/dL
eAG (mmol/L): 6.8 mmol/L

## 2020-09-30 LAB — LIPID PANEL
Cholesterol: 176 mg/dL (ref <200)
HDL: 43 mg/dL (ref >=40)
LDL Cholesterol (Calc): 113 mg/dL (calc) — ABNORMAL HIGH
Non-HDL Cholesterol (Calc): 133 mg/dL (calc) — ABNORMAL HIGH (ref <130)
Total CHOL/HDL Ratio: 4.1 (calc) (ref <5.0)
Triglycerides: 97 mg/dL (ref <150)

## 2020-10-02 ENCOUNTER — Other Ambulatory Visit: Payer: Self-pay | Admitting: Family Medicine

## 2020-10-04 ENCOUNTER — Other Ambulatory Visit: Payer: Self-pay | Admitting: Family Medicine

## 2020-10-04 MED ORDER — PRAVASTATIN SODIUM 40 MG PO TABS: ORAL_TABLET | ORAL | 3 refills | Status: DC

## 2020-10-17 ENCOUNTER — Other Ambulatory Visit: Payer: Self-pay

## 2020-10-17 ENCOUNTER — Ambulatory Visit (INDEPENDENT_AMBULATORY_CARE_PROVIDER_SITE_OTHER): Payer: Commercial Managed Care - PPO | Admitting: Family Medicine

## 2020-10-17 ENCOUNTER — Encounter: Payer: Self-pay | Admitting: Family Medicine

## 2020-10-17 DIAGNOSIS — N529 Male erectile dysfunction, unspecified: Secondary | ICD-10-CM | POA: Diagnosis not present

## 2020-10-17 DIAGNOSIS — I10 Essential (primary) hypertension: Secondary | ICD-10-CM | POA: Diagnosis not present

## 2020-10-17 MED ORDER — SILDENAFIL CITRATE 100 MG PO TABS: 50.0000 mg | ORAL_TABLET | Freq: Every day | ORAL | 11 refills | Status: AC | PRN

## 2020-10-17 NOTE — Assessment & Plan Note (Signed)
Sildenafil renewed

## 2020-10-17 NOTE — Progress Notes (Signed)
Randy Thompson - 45 y.o. male MRN [id number]  Date of birth: Apr 03, 1976  Subjective Chief Complaint  Patient presents with   Hypertension    HPI Randy Thompson is a 45 y.o. male here today for follow up of HTN.  Treated with amlodipine.  BP elevated today, states that he has not taken medication today.  He has not been checking BP at home but wife is a Engineer, civil (consulting) and states that he can get her to check.  He denies chest pain, shortness of breath, palpitations, headache or vision changes.    He is also requesting renew of sildenafil.  Working well for him, denies side effects.    ROS:  A comprehensive ROS was completed and negative except as noted per HPI  Allergies  Allergen Reactions   Penicillins Rash    Past Medical History:  Diagnosis Date   Allergy    Asthma    childhood   Chronic chest pain 05/10/2017   Chronic low back pain    Hyperlipidemia    Hypertension    Mild obstructive sleep apnea 01/21/2017   Morbid obesity (HCC) 01/26/2014   Nocturnal leg cramps 05/10/2017   Prediabetes 05/10/2017    History reviewed. No pertinent surgical history.  Social History   Socioeconomic History   Marital status: Married    Spouse name: Not on file   Number of children: Not on file   Years of education: Not on file   Highest education level: Not on file  Occupational History   Not on file  Tobacco Use   Smoking status: Current Every Day Smoker    Types: Cigarettes   Smokeless tobacco: Never Used  Vaping Use   Vaping Use: Every day  Substance and Sexual Activity   Alcohol use: Yes   Drug use: No   Sexual activity: Yes  Other Topics Concern   Not on file  Social History Narrative   Not on file   Social Determinants of Health   Financial Resource Strain: Not on file  Food Insecurity: Not on file  Transportation Needs: Not on file  Physical Activity: Not on file  Stress: Not on file  Social Connections: Not on file    Family History  Problem  Relation Age of Onset   Cancer Mother 45       Lung   Asthma Father    Hypertension Father    Emphysema Father    Cirrhosis Father    Colon cancer Neg Hx    Prostate cancer Neg Hx    Heart attack Neg Hx    Stroke Neg Hx     Health Maintenance  Topic Date Due   Hepatitis C Screening  Never done   HIV Screening  Never done   COVID-19 Vaccine (2 - Pfizer 3-dose series) 11/04/2019   INFLUENZA VACCINE  12/08/2020 (Originally 04/10/2020)   TETANUS/TDAP  06/10/2029     ----------------------------------------------------------------------------------------------------------------------------------------------------------------------------------------------------------------- Physical Exam BP (!) 157/95 (BP Location: Left Arm, Patient Position: Sitting, Cuff Size: Large)    Pulse (!) 102    Temp 98 F (36.7 C)    Wt (!) 305 lb (138.3 kg)    SpO2 98%    BMI 40.24 kg/m   Physical Exam Constitutional:      Appearance: Normal appearance.  HENT:     Head: Normocephalic and atraumatic.  Eyes:     General: No scleral icterus. Cardiovascular:     Rate and Rhythm: Normal rate and regular rhythm.  Pulmonary:  Effort: Pulmonary effort is normal.     Breath sounds: Normal breath sounds.  Musculoskeletal:     Cervical back: Neck supple.  Skin:    General: Skin is warm.  Neurological:     General: No focal deficit present.     Mental Status: He is alert.     ------------------------------------------------------------------------------------------------------------------------------------------------------------------------------------------------------------------- Assessment and Plan  Hypertension goal BP (blood pressure) < 130/80 BP remains elevated, has not taken medication.  Reminded to take medication daily. He will have wife check BP readings at home and send me a log of these readings.  Encouraged low sodium diet.   Erectile dysfunction Sildenafil renewed.     Meds ordered this encounter  Medications   sildenafil (VIAGRA) 100 MG tablet    Sig: Take 0.5-1 tablets (50-100 mg total) by mouth daily as needed for erectile dysfunction.    Dispense:  10 tablet    Refill:  11    Return in about 3 months (around 01/14/2021) for HTN.    This visit occurred during the SARS-CoV-2 public health emergency.  Safety protocols were in place, including screening questions prior to the visit, additional usage of staff PPE, and extensive cleaning of exam room while observing appropriate contact time as indicated for disinfecting solutions.

## 2020-10-17 NOTE — Patient Instructions (Signed)
Send me blood pressure readings from home.  See me again in 3 months.

## 2020-10-17 NOTE — Assessment & Plan Note (Signed)
BP remains elevated, has not taken medication.  Reminded to take medication daily. He will have wife check BP readings at home and send me a log of these readings.  Encouraged low sodium diet.

## 2020-10-26 ENCOUNTER — Encounter: Payer: Self-pay | Admitting: Family Medicine

## 2021-01-16 ENCOUNTER — Encounter: Payer: Self-pay | Admitting: Family Medicine

## 2021-01-16 ENCOUNTER — Ambulatory Visit: Payer: Commercial Managed Care - PPO | Admitting: Family Medicine

## 2021-01-16 ENCOUNTER — Other Ambulatory Visit: Payer: Self-pay

## 2021-01-16 ENCOUNTER — Ambulatory Visit (INDEPENDENT_AMBULATORY_CARE_PROVIDER_SITE_OTHER): Payer: Commercial Managed Care - PPO | Admitting: Family Medicine

## 2021-01-16 DIAGNOSIS — I10 Essential (primary) hypertension: Secondary | ICD-10-CM | POA: Diagnosis not present

## 2021-01-16 NOTE — Patient Instructions (Signed)

## 2021-01-16 NOTE — Progress Notes (Signed)
Randy Thompson - 45 y.o. male MRN [id number]  Date of birth: [birth year]  Subjective Chief Complaint  Patient presents with  . Hypertension    HPI Randy Thompson is a 45 y.o. male here today for folllow up of HTN.  This is currently managed with amlodipine 10mg  daily.  His wife is checking his BP at home reports systolic readings in the 130's but unsure of diastolic readings.   BP improved in clinic today but not quite at goal.  Denies symptoms of HTN including chest pain, shortness of breath, palpitations, headache or vision changes.  He does eat a fairly high salt diet with a large amount fast food.   ROS:  A comprehensive ROS was completed and negative except as noted per HPI  Allergies  Allergen Reactions  . Penicillins Rash    Past Medical History:  Diagnosis Date  . Allergy   . Asthma    childhood  . Chronic chest pain 05/10/2017  . Chronic low back pain   . Hyperlipidemia   . Hypertension   . 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 Morbid obesity (HCC) 01/26/2014  . Nocturnal leg cramps 05/10/2017  . Prediabetes 05/10/2017    History reviewed. No pertinent surgical history.  Social History   Socioeconomic History  . Marital status: Married    Spouse name: Not on file  . Number of children: Not on file  . Years of education: Not on file  . Highest education level: Not on file  Occupational History  . Not on file  Tobacco Use  . Smoking status: Current Every Day Smoker    Types: Cigarettes  . Smokeless tobacco: Never Used  Vaping Use  . Vaping Use: Every day  Substance and Sexual Activity  . Alcohol use: Yes  . Drug use: No  . Sexual activity: Yes  Other Topics Concern  . Not on file  Social History Narrative  . Not on file   Social Determinants of Health   Financial Resource Strain: Not on file  Food Insecurity: Not on file  Transportation Needs: Not on file  Physical Activity: Not on file  Stress: Not on file  Social Connections: Not on file     Family History  Problem Relation Age of Onset  . Cancer Mother 42       Lung  . Asthma Father   . Hypertension Father   . Emphysema Father   . Cirrhosis Father   . Colon cancer Neg Hx   . Prostate cancer Neg Hx   . Heart attack Neg Hx   . Stroke Neg Hx     Health Maintenance  Topic Date Due  . HIV Screening  Never done  . Hepatitis C Screening  Never done  . COVID-19 Vaccine (2 - Pfizer 3-dose series) 11/04/2019  . INFLUENZA VACCINE  04/10/2021  . TETANUS/TDAP  06/10/2029  . HPV VACCINES  Aged Out     ----------------------------------------------------------------------------------------------------------------------------------------------------------------------------------------------------------------- Physical Exam BP (!) 141/88 (BP Location: Left Arm, Patient Position: Sitting, Cuff Size: Large)   Pulse (!) 102   Temp 98.2 F (36.8 C)   Ht 6\' 1"  (1.854 m)   Wt (!) 306 lb (138.8 kg)   SpO2 100%   BMI 40.37 kg/m   Physical Exam Constitutional:      Appearance: Normal appearance. He is obese.  Eyes:     General: No scleral icterus. Cardiovascular:     Rate and Rhythm: Normal rate and regular rhythm.  Pulmonary:  Effort: Pulmonary effort is normal.     Breath sounds: Normal breath sounds.  Musculoskeletal:     Cervical back: Neck supple.  Neurological:     General: No focal deficit present.     Mental Status: He is alert.  Psychiatric:        Mood and Affect: Mood normal.        Behavior: Behavior normal.     ------------------------------------------------------------------------------------------------------------------------------------------------------------------------------------------------------------------- Assessment and Plan  Hypertension goal BP (blood pressure) < 130/80 Blood pressure is not at goal at for age and co-morbidities.  I recommend continuation of amlodipine at 10mg  and working on dietary changes including reduction in  fast food and sodium intake.  Continue home BP monitoring.  Return in about 3 months (around 04/18/2021) for HTN.     No orders of the defined types were placed in this encounter.   Return in about 3 months (around 04/18/2021) for HTN.    This visit occurred during the SARS-CoV-2 public health emergency.  Safety protocols were in place, including screening questions prior to the visit, additional usage of staff PPE, and extensive cleaning of exam room while observing appropriate contact time as indicated for disinfecting solutions.

## 2021-01-16 NOTE — Assessment & Plan Note (Signed)
Blood pressure is not at goal at for age and co-morbidities.  I recommend continuation of amlodipine at 10mg  and working on dietary changes including reduction in fast food and sodium intake.  Continue home BP monitoring.  Return in about 3 months (around 04/18/2021) for HTN.

## 2021-04-18 ENCOUNTER — Ambulatory Visit: Payer: Commercial Managed Care - PPO | Admitting: Family Medicine

## 2021-04-23 ENCOUNTER — Other Ambulatory Visit: Payer: Self-pay | Admitting: Family Medicine

## 2021-04-24 NOTE — Telephone Encounter (Signed)
Please call patient to reschedule appointment for hypertension. Sending 30 day meds to hold until appt.

## 2021-04-24 NOTE — Telephone Encounter (Signed)
rescheduled appointment for hypertension 05/05/2021- tvt

## 2021-05-05 ENCOUNTER — Other Ambulatory Visit: Payer: Self-pay

## 2021-05-05 ENCOUNTER — Ambulatory Visit: Payer: Commercial Managed Care - PPO | Admitting: Family Medicine

## 2021-05-05 ENCOUNTER — Encounter: Payer: Self-pay | Admitting: Family Medicine

## 2021-05-05 DIAGNOSIS — E785 Hyperlipidemia, unspecified: Secondary | ICD-10-CM | POA: Diagnosis not present

## 2021-05-05 DIAGNOSIS — I10 Essential (primary) hypertension: Secondary | ICD-10-CM

## 2021-05-05 MED ORDER — AMLODIPINE BESYLATE 10 MG PO TABS: 10.0000 mg | ORAL_TABLET | Freq: Every day | ORAL | 2 refills | Status: DC

## 2021-05-07 NOTE — Progress Notes (Signed)
Randy Thompson - 45 y.o. male MRN [id number]  Date of birth: Feb 15, 1976  Subjective Chief Complaint  Patient presents with   Hypertension    HPI Randy Thompson is a 45 year old male here today for follow-up of hypertension.  He reports that he is doing quite well.  He continues to do well with amlodipine for management of hypertension.  Blood pressure today is well controlled.  He has not noted any side effects related to medication.  He has not had chest pain, shortness of breath, palpitations, headache or vision changes.  He continues to tolerate pravastatin well for management of hyperlipidemia.  No myalgias noted.  ROS:  A comprehensive ROS was completed and negative except as noted per HPI  Allergies  Allergen Reactions   Penicillins Rash    Past Medical History:  Diagnosis Date   Allergy    Asthma    childhood   Chronic chest pain 05/10/2017   Chronic low back pain    Hyperlipidemia    Hypertension    Mild obstructive sleep apnea 01/21/2017   Morbid obesity (HCC) 01/26/2014   Nocturnal leg cramps 05/10/2017   Prediabetes 05/10/2017    History reviewed. No pertinent surgical history.  Social History   Socioeconomic History   Marital status: Married    Spouse name: Not on file   Number of children: Not on file   Years of education: Not on file   Highest education level: Not on file  Occupational History   Not on file  Tobacco Use   Smoking status: Every Day    Types: Cigarettes   Smokeless tobacco: Never  Vaping Use   Vaping Use: Every day  Substance and Sexual Activity   Alcohol use: Yes   Drug use: No   Sexual activity: Yes  Other Topics Concern   Not on file  Social History Narrative   Not on file   Social Determinants of Health   Financial Resource Strain: Not on file  Food Insecurity: Not on file  Transportation Needs: Not on file  Physical Activity: Not on file  Stress: Not on file  Social Connections: Not on file    Family History  Problem Relation  Age of Onset   Cancer Mother 19       Lung   Asthma Father    Hypertension Father    Emphysema Father    Cirrhosis Father    Colon cancer Neg Hx    Prostate cancer Neg Hx    Heart attack Neg Hx    Stroke Neg Hx     Health Maintenance  Topic Date Due   HIV Screening  Never done   Hepatitis C Screening  Never done   COVID-19 Vaccine (2 - Pfizer risk series) 11/04/2019   INFLUENZA VACCINE  12/08/2021 (Originally 04/10/2021)   Pneumococcal Vaccine 33-41 Years old (3 - PPSV23 or PCV20) 07/03/2023   TETANUS/TDAP  06/10/2029   HPV VACCINES  Aged Out     ----------------------------------------------------------------------------------------------------------------------------------------------------------------------------------------------------------------- Physical Exam BP 128/89 (BP Location: Left Arm, Patient Position: Sitting, Cuff Size: Large)   Pulse 95   Temp 98.2 F (36.8 C)   Ht 6\' 1"  (1.854 m)   Wt (!) 304 lb (137.9 kg)   SpO2 99%   BMI 40.11 kg/m   Physical Exam Constitutional:      Appearance: Normal appearance.  Eyes:     General: No scleral icterus. Cardiovascular:     Rate and Rhythm: Normal rate and regular rhythm.  Pulmonary:  Effort: Pulmonary effort is normal.     Breath sounds: Normal breath sounds.  Neurological:     General: No focal deficit present.     Mental Status: He is alert.  Psychiatric:        Mood and Affect: Mood normal.        Behavior: Behavior normal.    ------------------------------------------------------------------------------------------------------------------------------------------------------------------------------------------------------------------- Assessment and Plan  Hypertension goal BP (blood pressure) < 130/80 Blood pressure remains well controlled.  Continue amlodipine at current strength.  Medication renewed.  Hyperlipidemia LDL goal <100 He is tolerating pravastatin well.  Continue at current  strength.   Meds ordered this encounter  Medications   amLODipine (NORVASC) 10 MG tablet    Sig: Take 1 tablet (10 mg total) by mouth daily.    Dispense:  90 tablet    Refill:  2    This prescription was filled on 04/03/2021. Any refills authorized will be placed on file.    Return in about 6 months (around 11/05/2021) for HTN.    This visit occurred during the SARS-CoV-2 public health emergency.  Safety protocols were in place, including screening questions prior to the visit, additional usage of staff PPE, and extensive cleaning of exam room while observing appropriate contact time as indicated for disinfecting solutions.

## 2021-05-07 NOTE — Assessment & Plan Note (Signed)
He is tolerating pravastatin well.  Continue at current strength.

## 2021-05-07 NOTE — Assessment & Plan Note (Signed)
Blood pressure remains well controlled.  Continue amlodipine at current strength.  Medication renewed.

## 2021-11-10 ENCOUNTER — Ambulatory Visit: Payer: Commercial Managed Care - PPO | Admitting: Family Medicine

## 2022-02-02 ENCOUNTER — Other Ambulatory Visit: Payer: Self-pay | Admitting: Family Medicine

## 2022-02-02 NOTE — Telephone Encounter (Signed)
LVM for patient to call back to get an appt scheduled in August. AMUCK

## 2022-02-02 NOTE — Telephone Encounter (Signed)
Pls contact the patient to schedule appt for August. Sending 90 day refill. Labs needed for continued refills. Thanks

## 2022-04-19 ENCOUNTER — Encounter: Payer: Self-pay | Admitting: Family Medicine

## 2022-06-15 ENCOUNTER — Encounter: Payer: Self-pay | Admitting: Family Medicine

## 2022-06-28 ENCOUNTER — Encounter: Payer: Self-pay | Admitting: Family Medicine

## 2022-06-28 ENCOUNTER — Ambulatory Visit (INDEPENDENT_AMBULATORY_CARE_PROVIDER_SITE_OTHER): Payer: Self-pay | Admitting: Family Medicine

## 2022-06-28 VITALS — BP 124/88 | HR 113 | Ht 73.0 in | Wt 315.0 lb

## 2022-06-28 DIAGNOSIS — Z0000 Encounter for general adult medical examination without abnormal findings: Secondary | ICD-10-CM

## 2022-06-28 DIAGNOSIS — E785 Hyperlipidemia, unspecified: Secondary | ICD-10-CM

## 2022-06-28 DIAGNOSIS — R7303 Prediabetes: Secondary | ICD-10-CM

## 2022-06-28 DIAGNOSIS — I10 Essential (primary) hypertension: Secondary | ICD-10-CM

## 2022-06-28 MED ORDER — PRAVASTATIN SODIUM 40 MG PO TABS: ORAL_TABLET | ORAL | 3 refills | Status: DC

## 2022-06-28 MED ORDER — AMLODIPINE BESYLATE 10 MG PO TABS: 10.0000 mg | ORAL_TABLET | Freq: Every day | ORAL | 2 refills | Status: DC

## 2022-06-28 NOTE — Progress Notes (Signed)
Randy Thompson - 46 y.o. male MRN [id number]  Date of birth: Sep 07, 1976  Subjective Chief Complaint  Patient presents with   Annual Exam    Pt states has has Left eye watering off and on x around one year.     HPI Randy Thompson is a 46 y.o. male here today for annual exam.   Reports that he is feeling well.  Having issues with watery eyes.   He reports taking prescribed medications.  BP is well controlled.    His job does keep him pretty active.  He does feel that diet could be better.   He does vape regularly.   Does not have an interest in quitting.  Consumes EtOH occasionally.   He declines flu vaccine.   Review of Systems  Constitutional:  Negative for chills, fever, malaise/fatigue and weight loss.  HENT:  Negative for congestion, ear pain and sore throat.   Eyes:  Negative for blurred vision, double vision and pain.  Respiratory:  Negative for cough and shortness of breath.   Cardiovascular:  Negative for chest pain and palpitations.  Gastrointestinal:  Negative for abdominal pain, blood in stool, constipation, heartburn and nausea.  Genitourinary:  Negative for dysuria and urgency.  Musculoskeletal:  Negative for joint pain and myalgias.  Neurological:  Negative for dizziness and headaches.  Endo/Heme/Allergies:  Does not bruise/bleed easily.  Psychiatric/Behavioral:  Negative for depression. The patient is not nervous/anxious and does not have insomnia.     Allergies  Allergen Reactions   Penicillins Rash    Past Medical History:  Diagnosis Date   Allergy    Asthma    childhood   Chronic chest pain 05/10/2017   Chronic low back pain    Hyperlipidemia    Hypertension    Mild obstructive sleep apnea 01/21/2017   Morbid obesity ([place]) 01/26/2014   Nocturnal leg cramps 05/10/2017   Prediabetes 05/10/2017    History reviewed. No pertinent surgical history.  Social History   Socioeconomic History   Marital status: Married    Spouse name: Not on file   Number  of children: Not on file   Years of education: Not on file   Highest education level: Not on file  Occupational History   Not on file  Tobacco Use   Smoking status: Every Day    Types: Cigarettes   Smokeless tobacco: Never  Vaping Use   Vaping Use: Every day  Substance and Sexual Activity   Alcohol use: Yes   Drug use: No   Sexual activity: Yes  Other Topics Concern   Not on file  Social History Narrative   Not on file   Social Determinants of Health   Financial Resource Strain: Not on file  Food Insecurity: Not on file  Transportation Needs: Not on file  Physical Activity: Not on file  Stress: Not on file  Social Connections: Not on file    Family History  Problem Relation Age of Onset   Cancer Mother 48       Lung   Asthma Father    Hypertension Father    Emphysema Father    Cirrhosis Father    Colon cancer Neg Hx    Prostate cancer Neg Hx    Heart attack Neg Hx    Stroke Neg Hx     Health Maintenance  Topic Date Due   HIV Screening  Never done   Hepatitis C Screening  Never done   COVID-19 Vaccine (2 - [employer]  risk series) 07/14/2022 (Originally 11/04/2019)   INFLUENZA VACCINE  12/09/2022 (Originally 04/10/2022)   TETANUS/TDAP  06/10/2029   HPV VACCINES  Aged Out     ----------------------------------------------------------------------------------------------------------------------------------------------------------------------------------------------------------------- Physical Exam BP 124/88   Pulse (!) 113   Ht 6\' 1"  (1.854 m)   Wt (!) 315 lb (142.9 kg)   SpO2 98%   BMI 41.56 kg/m   Physical Exam Constitutional:      General: He is not in acute distress. HENT:     Head: Normocephalic and atraumatic.     Right Ear: Tympanic membrane and external ear normal.     Left Ear: Tympanic membrane and external ear normal.  Eyes:     General: No scleral icterus. Neck:     Thyroid: No thyromegaly.  Cardiovascular:     Rate and Rhythm: Normal rate  and regular rhythm.     Heart sounds: Normal heart sounds.  Pulmonary:     Effort: Pulmonary effort is normal.     Breath sounds: Normal breath sounds.  Abdominal:     General: Bowel sounds are normal. There is no distension.     Palpations: Abdomen is soft.     Tenderness: There is no abdominal tenderness. There is no guarding.  Musculoskeletal:     Cervical back: Normal range of motion.  Lymphadenopathy:     Cervical: No cervical adenopathy.  Skin:    General: Skin is warm and dry.     Findings: No rash.  Neurological:     Mental Status: He is alert and oriented to person, place, and time.     Cranial Nerves: No cranial nerve deficit.     Motor: No abnormal muscle tone.  Psychiatric:        Mood and Affect: Mood normal.        Behavior: Behavior normal.     ------------------------------------------------------------------------------------------------------------------------------------------------------------------------------------------------------------------- Assessment and Plan  Hypertension goal BP (blood pressure) < 130/80 BP is well controlled today, however based on fill patterns he doesn't seem to be compliant with medication all of the time.  Amlodipine renewed.   Well adult exam Well adult Orders Placed This Encounter  Procedures   COMPLETE METABOLIC PANEL WITH GFR   CBC with Differential   Lipid Panel w/reflex Direct LDL   TSH   HgB A1c  Screening: Per Lab orders.   Immunizations: Declined flu vaccine Anticipatory guidance/risk factor reduction.  Counseled on smoking cessation as well as weight loss.  Additional recommendations per AVS.   No orders of the defined types were placed in this encounter.   No follow-ups on file.    This visit occurred during the SARS-CoV-2 public health emergency.  Safety protocols were in place, including screening questions prior to the visit, additional usage of staff PPE, and extensive cleaning of exam room while  observing appropriate contact time as indicated for disinfecting solutions.

## 2022-06-28 NOTE — Assessment & Plan Note (Signed)
BP is well controlled today, however based on fill patterns he doesn't seem to be compliant with medication all of the time.  Amlodipine renewed.

## 2022-06-28 NOTE — Patient Instructions (Signed)

## 2022-06-28 NOTE — Assessment & Plan Note (Addendum)
Well adult Orders Placed This Encounter  Procedures  . COMPLETE METABOLIC PANEL WITH GFR  . CBC with Differential  . Lipid Panel w/reflex Direct LDL  . TSH  . HgB A1c  Screening: Per Lab orders.   Immunizations: Declined flu vaccine Anticipatory guidance/risk factor reduction.  Counseled on smoking cessation as well as weight loss.  Additional recommendations per AVS.

## 2022-06-30 LAB — COMPLETE METABOLIC PANEL WITH GFR
AG Ratio: 1.8 (calc) (ref 1.0–2.5)
ALT: 22 U/L (ref 9–46)
AST: 18 U/L (ref 10–40)
Albumin: 5 g/dL (ref 3.6–5.1)
Alkaline phosphatase (APISO): 71 U/L (ref 36–130)
BUN: 13 mg/dL (ref 7–25)
CO2: 22 mmol/L (ref 20–32)
Calcium: 9.6 mg/dL (ref 8.6–10.3)
Chloride: 106 mmol/L (ref 98–110)
Creat: 1.21 mg/dL (ref 0.60–1.29)
Globulin: 2.8 g/dL (calc) (ref 1.9–3.7)
Glucose, Bld: 89 mg/dL (ref 65–99)
Potassium: 4.3 mmol/L (ref 3.5–5.3)
Sodium: 141 mmol/L (ref 135–146)
Total Bilirubin: 0.4 mg/dL (ref 0.2–1.2)
Total Protein: 7.8 g/dL (ref 6.1–8.1)
eGFR: 76 mL/min/{1.73_m2} (ref >=60)

## 2022-06-30 LAB — CBC WITH DIFFERENTIAL/PLATELET
Absolute Monocytes: 502 cells/uL (ref 200–950)
Basophils Absolute: 61 cells/uL (ref 0–200)
Basophils Relative: 0.8 %
Eosinophils Absolute: 167 cells/uL (ref 15–500)
Eosinophils Relative: 2.2 %
HCT: 49.3 % (ref 38.5–50.0)
Hemoglobin: 16.8 g/dL (ref 13.2–17.1)
Lymphs Abs: 4286 cells/uL — ABNORMAL HIGH (ref 850–3900)
MCH: 29.5 pg (ref 27.0–33.0)
MCHC: 34.1 g/dL (ref 32.0–36.0)
MCV: 86.6 fL (ref 80.0–100.0)
MPV: 10.5 fL (ref 7.5–12.5)
Monocytes Relative: 6.6 %
Neutro Abs: 2584 cells/uL (ref 1500–7800)
Neutrophils Relative %: 34 %
Platelets: 313 10*3/uL (ref 140–400)
RBC: 5.69 10*6/uL (ref 4.20–5.80)
RDW: 14.6 % (ref 11.0–15.0)
Total Lymphocyte: 56.4 %
WBC: 7.6 10*3/uL (ref 3.8–10.8)

## 2022-06-30 LAB — LIPID PANEL W/REFLEX DIRECT LDL
Cholesterol: 197 mg/dL (ref <200)
HDL: 40 mg/dL (ref >=40)
LDL Cholesterol (Calc): 134 mg/dL (calc) — ABNORMAL HIGH
Non-HDL Cholesterol (Calc): 157 mg/dL (calc) — ABNORMAL HIGH (ref <130)
Total CHOL/HDL Ratio: 4.9 (calc) (ref <5.0)
Triglycerides: 121 mg/dL (ref <150)

## 2022-06-30 LAB — HEMOGLOBIN A1C
Hgb A1c MFr Bld: 5.9 % of total Hgb — ABNORMAL HIGH (ref <5.7)
Mean Plasma Glucose: 123 mg/dL
eAG (mmol/L): 6.8 mmol/L

## 2022-06-30 LAB — TSH: TSH: 2.04 mIU/L (ref 0.40–4.50)

## 2023-05-22 ENCOUNTER — Other Ambulatory Visit: Payer: Self-pay | Admitting: Family Medicine

## 2023-07-04 ENCOUNTER — Ambulatory Visit (INDEPENDENT_AMBULATORY_CARE_PROVIDER_SITE_OTHER): Payer: No Typology Code available for payment source | Admitting: Family Medicine

## 2023-07-04 ENCOUNTER — Encounter: Payer: Self-pay | Admitting: Family Medicine

## 2023-07-04 VITALS — BP 132/83 | HR 99 | Ht 73.0 in | Wt 332.0 lb

## 2023-07-04 DIAGNOSIS — R7303 Prediabetes: Secondary | ICD-10-CM | POA: Diagnosis not present

## 2023-07-04 DIAGNOSIS — Z1211 Encounter for screening for malignant neoplasm of colon: Secondary | ICD-10-CM | POA: Diagnosis not present

## 2023-07-04 DIAGNOSIS — I10 Essential (primary) hypertension: Secondary | ICD-10-CM | POA: Diagnosis not present

## 2023-07-04 DIAGNOSIS — Z0000 Encounter for general adult medical examination without abnormal findings: Secondary | ICD-10-CM | POA: Diagnosis not present

## 2023-07-04 DIAGNOSIS — E785 Hyperlipidemia, unspecified: Secondary | ICD-10-CM

## 2023-07-04 MED ORDER — PRAVASTATIN SODIUM 40 MG PO TABS: ORAL_TABLET | ORAL | 3 refills | Status: DC

## 2023-07-04 MED ORDER — AMLODIPINE BESYLATE 10 MG PO TABS: 10.0000 mg | ORAL_TABLET | Freq: Every day | ORAL | 1 refills | Status: DC

## 2023-07-04 NOTE — Assessment & Plan Note (Signed)
Well adult Orders Placed This Encounter  Procedures   CMP14+EGFR   CBC with Differential/Platelet   Lipid Panel With LDL/HDL Ratio   HgB A1c   Cologuard  Screening: Per Lab orders.   Immunizations: Declined flu vaccine Anticipatory guidance/risk factor reduction.  Counseled on smoking cessation as well as weight loss.  Additional recommendations per AVS.

## 2023-07-04 NOTE — Progress Notes (Signed)
Randy Thompson - 47 y.o. male MRN [id number]  Date of birth: 1976-08-22  Subjective Chief Complaint  Patient presents with   Annual Exam    HPI Randy Thompson is a 47 y.o. male here today for annual exam.   He reports that he is doing pretty well.   BP elevated on initial check today.  Continues on amlodipine 49m daily.    He is moderately active.  Feels that diet is pretty good.   He is a smoker.  He does consume EtOH.   Declines immunizations.   Due for colon cancer screening.  Would prefer cologuard.   Review of Systems  Constitutional:  Negative for chills, fever, malaise/fatigue and weight loss.  HENT:  Negative for congestion, ear pain and sore throat.   Eyes:  Negative for blurred vision, double vision and pain.  Respiratory:  Negative for cough and shortness of breath.   Cardiovascular:  Negative for chest pain and palpitations.  Gastrointestinal:  Negative for abdominal pain, blood in stool, constipation, heartburn and nausea.  Genitourinary:  Negative for dysuria and urgency.  Musculoskeletal:  Negative for joint pain and myalgias.  Neurological:  Negative for dizziness and headaches.  Endo/Heme/Allergies:  Does not bruise/bleed easily.  Psychiatric/Behavioral:  Negative for depression. The patient is not nervous/anxious and does not have insomnia.     Allergies  Allergen Reactions   Penicillins Rash    Past Medical History:  Diagnosis Date   Allergy    Asthma    childhood   Chronic chest pain 05/10/2017   Chronic low back pain    Hyperlipidemia    Hypertension    Mild obstructive sleep apnea 01/21/2017   Morbid obesity (HCC) 01/26/2014   Nocturnal leg cramps 05/10/2017   Prediabetes 05/10/2017    History reviewed. No pertinent surgical history.  Social History   Socioeconomic History   Marital status: Married    Spouse name: Not on file   Number of children: Not on file   Years of education: Not on file   Highest education level: Not on file   Occupational History   Not on file  Tobacco Use   Smoking status: Every Day    Types: Cigarettes   Smokeless tobacco: Never  Vaping Use   Vaping status: Every Day  Substance and Sexual Activity   Alcohol use: Yes   Drug use: No   Sexual activity: Yes  Other Topics Concern   Not on file  Social History Narrative   Not on file   Social Determinants of Health   Financial Resource Strain: Not on file  Food Insecurity: Not on file  Transportation Needs: Not on file  Physical Activity: Not on file  Stress: Not on file  Social Connections: Not on file    Family History  Problem Relation Age of Onset   Cancer Mother 4       Lung   Asthma Father    Hypertension Father    Emphysema Father    Cirrhosis Father    Colon cancer Neg Hx    Prostate cancer Neg Hx    Heart attack Neg Hx    Stroke Neg Hx     Health Maintenance  Topic Date Due   COVID-19 Vaccine (2 - Pfizer risk series) 07/20/2023 (Originally 11/04/2019)   INFLUENZA VACCINE  12/09/2023 (Originally 04/11/2023)   Colonoscopy  07/03/2024 (Originally 03/23/2023)   Hepatitis C Screening  07/03/2024 (Originally 03/22/1996)   HIV Screening  07/03/2024 (Originally 03/22/1993)  DTaP/Tdap/Td (3 - Td or Tdap) 06/10/2029   HPV VACCINES  Aged Out     ----------------------------------------------------------------------------------------------------------------------------------------------------------------------------------------------------------------- Physical Exam BP 132/83   Pulse 99   Ht 6\' 1"  (1.854 m)   Wt (!) 332 lb (150.6 kg)   SpO2 98%   BMI 43.80 kg/m   Physical Exam Constitutional:      General: He is not in acute distress. HENT:     Head: Normocephalic and atraumatic.     Right Ear: Tympanic membrane and external ear normal.     Left Ear: Tympanic membrane and external ear normal.  Eyes:     General: No scleral icterus. Neck:     Thyroid: No thyromegaly.  Cardiovascular:     Rate and Rhythm:  Normal rate and regular rhythm.     Heart sounds: Normal heart sounds.  Pulmonary:     Effort: Pulmonary effort is normal.     Breath sounds: Normal breath sounds.  Abdominal:     General: Bowel sounds are normal. There is no distension.     Palpations: Abdomen is soft.     Tenderness: There is no abdominal tenderness. There is no guarding.  Musculoskeletal:     Cervical back: Normal range of motion.  Lymphadenopathy:     Cervical: No cervical adenopathy.  Skin:    General: Skin is warm and dry.     Findings: No rash.  Neurological:     Mental Status: He is alert and oriented to person, place, and time.     Cranial Nerves: No cranial nerve deficit.     Motor: No abnormal muscle tone.  Psychiatric:        Mood and Affect: Mood normal.        Behavior: Behavior normal.     ------------------------------------------------------------------------------------------------------------------------------------------------------------------------------------------------------------------- Assessment and Plan  Well adult exam Well adult Orders Placed This Encounter  Procedures   CMP14+EGFR   CBC with Differential/Platelet   Lipid Panel With LDL/HDL Ratio   HgB A1c   Cologuard  Screening: Per Lab orders.   Immunizations: Declined flu vaccine Anticipatory guidance/risk factor reduction.  Counseled on smoking cessation as well as weight loss.  Additional recommendations per AVS.   Meds ordered this encounter  Medications   amLODipine (NORVASC) 10 MG tablet    Sig: Take 1 tablet (10 mg total) by mouth daily.    Dispense:  90 tablet    Refill:  1   pravastatin (PRAVACHOL) 40 MG tablet    Sig: Take 1 tablet by mouth once daily    Dispense:  90 tablet    Refill:  3    No follow-ups on file.    This visit occurred during the SARS-CoV-2 public health emergency.  Safety protocols were in place, including screening questions prior to the visit, additional usage of staff PPE, and  extensive cleaning of exam room while observing appropriate contact time as indicated for disinfecting solutions.

## 2023-07-04 NOTE — Patient Instructions (Signed)

## 2023-07-05 LAB — CBC WITH DIFFERENTIAL/PLATELET
Basophils Absolute: 0.1 10*3/uL (ref 0.0–0.2)
Basos: 1 %
EOS (ABSOLUTE): 0.2 10*3/uL (ref 0.0–0.4)
Eos: 2 %
Hematocrit: 49.5 % (ref 37.5–51.0)
Hemoglobin: 15.8 g/dL (ref 13.0–17.7)
Immature Grans (Abs): 0 10*3/uL (ref 0.0–0.1)
Immature Granulocytes: 0 %
Lymphocytes Absolute: 4.6 10*3/uL — ABNORMAL HIGH (ref 0.7–3.1)
Lymphs: 49 %
MCH: 28.7 pg (ref 26.6–33.0)
MCHC: 31.9 g/dL (ref 31.5–35.7)
MCV: 90 fL (ref 79–97)
Monocytes Absolute: 0.9 10*3/uL (ref 0.1–0.9)
Monocytes: 9 %
Neutrophils Absolute: 3.7 10*3/uL (ref 1.4–7.0)
Neutrophils: 39 %
Platelets: 319 10*3/uL (ref 150–450)
RBC: 5.5 x10E6/uL (ref 4.14–5.80)
RDW: 15.1 % (ref 11.6–15.4)
WBC: 9.4 10*3/uL (ref 3.4–10.8)

## 2023-07-05 LAB — CMP14+EGFR
ALT: 44 [IU]/L (ref 0–44)
AST: 25 [IU]/L (ref 0–40)
Albumin: 4.6 g/dL (ref 4.1–5.1)
Alkaline Phosphatase: 83 [IU]/L (ref 44–121)
BUN/Creatinine Ratio: 12 (ref 9–20)
BUN: 13 mg/dL (ref 6–24)
Bilirubin Total: <0.2 mg/dL (ref 0.0–1.2)
CO2: 21 mmol/L (ref 20–29)
Calcium: 9.5 mg/dL (ref 8.7–10.2)
Chloride: 102 mmol/L (ref 96–106)
Creatinine, Ser: 1.08 mg/dL (ref 0.76–1.27)
Globulin, Total: 2.8 g/dL (ref 1.5–4.5)
Glucose: 94 mg/dL (ref 70–99)
Potassium: 4.3 mmol/L (ref 3.5–5.2)
Sodium: 140 mmol/L (ref 134–144)
Total Protein: 7.4 g/dL (ref 6.0–8.5)
eGFR: 86 mL/min/{1.73_m2} (ref >59)

## 2023-07-05 LAB — LIPID PANEL WITH LDL/HDL RATIO
Cholesterol, Total: 198 mg/dL (ref 100–199)
HDL: 44 mg/dL (ref >39)
LDL Chol Calc (NIH): 133 mg/dL — ABNORMAL HIGH (ref 0–99)
LDL/HDL Ratio: 3 ratio (ref 0.0–3.6)
Triglycerides: 117 mg/dL (ref 0–149)
VLDL Cholesterol Cal: 21 mg/dL (ref 5–40)

## 2023-07-05 LAB — HEMOGLOBIN A1C
Est. average glucose Bld gHb Est-mCnc: 131 mg/dL
Hgb A1c MFr Bld: 6.2 % — ABNORMAL HIGH (ref 4.8–5.6)

## 2023-12-26 ENCOUNTER — Encounter: Payer: Self-pay | Admitting: Family Medicine

## 2023-12-26 ENCOUNTER — Ambulatory Visit

## 2023-12-26 ENCOUNTER — Ambulatory Visit (INDEPENDENT_AMBULATORY_CARE_PROVIDER_SITE_OTHER): Admitting: Family Medicine

## 2023-12-26 VITALS — BP 140/92 | HR 115 | Ht 73.0 in | Wt 319.0 lb

## 2023-12-26 DIAGNOSIS — M79604 Pain in right leg: Secondary | ICD-10-CM

## 2023-12-26 NOTE — Progress Notes (Signed)
 Randy Thompson - 48 y.o. male MRN [id number]  Date of birth: 1975-10-05  Subjective Chief Complaint  Patient presents with   Leg Pain    HPI Randy Thompson is a 48 y.o. male here today with ocmplaint of R leg pain.  Started with pain in his R upper calf area  2 days ago.  Noted the swelling in the upper leg that evening.  This improved with Ibuprofen but still has feeling of tightness in the back of the leg and feeling that it is still swollen.  No difficulty walking.  Denies fever or chills.  Has not has dyspnea or chest pain.   ROS:  A comprehensive ROS was completed and negative except as noted per HPI  Allergies  Allergen Reactions   Penicillins Rash    Past Medical History:  Diagnosis Date   Allergy    Asthma    childhood   Chronic chest pain 05/10/2017   Chronic low back pain    Hyperlipidemia    Hypertension    Mild obstructive sleep apnea 01/21/2017   Morbid obesity (HCC) 01/26/2014   Nocturnal leg cramps 05/10/2017   Prediabetes 05/10/2017    History reviewed. No pertinent surgical history.  Social History   Socioeconomic History   Marital status: Married    Spouse name: Not on file   Number of children: Not on file   Years of education: Not on file   Highest education level: Not on file  Occupational History   Not on file  Tobacco Use   Smoking status: Every Day    Types: Cigarettes   Smokeless tobacco: Never  Vaping Use   Vaping status: Every Day  Substance and Sexual Activity   Alcohol use: Yes   Drug use: No   Sexual activity: Yes  Other Topics Concern   Not on file  Social History Narrative   Not on file   Social Drivers of Health   Financial Resource Strain: Not on file  Food Insecurity: Not on file  Transportation Needs: Not on file  Physical Activity: Not on file  Stress: Not on file  Social Connections: Not on file    Family History  Problem Relation Age of Onset   Cancer Mother 31       Lung   Asthma Father    Hypertension Father     Emphysema Father    Cirrhosis Father    Colon cancer Neg Hx    Prostate cancer Neg Hx    Heart attack Neg Hx    Stroke Neg Hx     Health Maintenance  Topic Date Due   COVID-19 Vaccine (2 - Pfizer risk series) 11/04/2019   Colonoscopy  07/03/2024 (Originally 03/23/2023)   Hepatitis C Screening  07/03/2024 (Originally 03/22/1996)   HIV Screening  07/03/2024 (Originally 03/22/1993)   INFLUENZA VACCINE  04/10/2024   Pneumococcal Vaccine 58-44 Years old (3 of 3 - PCV20 or PCV21) 03/22/2028   DTaP/Tdap/Td (3 - Td or Tdap) 06/10/2029   HPV VACCINES  Aged Out   Meningococcal B Vaccine  Aged Out     ----------------------------------------------------------------------------------------------------------------------------------------------------------------------------------------------------------------- Physical Exam BP (!) 140/92 (BP Location: Left Arm, Patient Position: Sitting, Cuff Size: Large)   Pulse (!) 115   Ht 6\' 1"  (1.854 m)   Wt (!) 319 lb (144.7 kg)   SpO2 98%   BMI 42.09 kg/m   Physical Exam Constitutional:      Appearance: Normal appearance.  Cardiovascular:     Rate and Rhythm:  Normal rate and regular rhythm.  Pulmonary:     Effort: Pulmonary effort is normal.     Breath sounds: Normal breath sounds.  Musculoskeletal:     Comments: Mild ttp in the popliteal space.  No edema noted.  No tenderness along the calf.   Neurological:     Mental Status: He is alert.     ------------------------------------------------------------------------------------------------------------------------------------------------------------------------------------------------------------------- Assessment and Plan  Right leg pain Stat venous duplex ordered to r/o DVT.  If this is negative he may proceed with handout for HEP to help with hamstrings and calf strain.     No orders of the defined types were placed in this encounter.   No follow-ups on file.

## 2023-12-26 NOTE — Assessment & Plan Note (Signed)
 Stat venous duplex ordered to r/o DVT.  If this is negative he may proceed with handout for HEP to help with hamstrings and calf strain.

## 2024-06-08 ENCOUNTER — Other Ambulatory Visit: Payer: Self-pay | Admitting: Family Medicine

## 2024-06-08 DIAGNOSIS — I10 Essential (primary) hypertension: Secondary | ICD-10-CM

## 2024-06-09 NOTE — Telephone Encounter (Signed)
 Pls contact pt to schedule annual appt due in October. Fasting labs. Sending 30 day med refill. Thx.

## 2024-06-09 NOTE — Telephone Encounter (Signed)
 Patient has been scheduled for annual exam with fasting labs

## 2024-07-03 ENCOUNTER — Encounter: Admitting: Family Medicine

## 2024-07-07 ENCOUNTER — Other Ambulatory Visit: Payer: Self-pay | Admitting: Family Medicine

## 2024-07-07 DIAGNOSIS — I10 Essential (primary) hypertension: Secondary | ICD-10-CM

## 2024-07-24 ENCOUNTER — Ambulatory Visit (INDEPENDENT_AMBULATORY_CARE_PROVIDER_SITE_OTHER): Admitting: Family Medicine

## 2024-07-24 ENCOUNTER — Encounter: Payer: Self-pay | Admitting: Family Medicine

## 2024-07-24 VITALS — BP 148/90 | HR 116 | Ht 73.0 in | Wt 329.0 lb

## 2024-07-24 DIAGNOSIS — Z0000 Encounter for general adult medical examination without abnormal findings: Secondary | ICD-10-CM

## 2024-07-24 DIAGNOSIS — Z1159 Encounter for screening for other viral diseases: Secondary | ICD-10-CM

## 2024-07-24 DIAGNOSIS — E785 Hyperlipidemia, unspecified: Secondary | ICD-10-CM | POA: Diagnosis not present

## 2024-07-24 DIAGNOSIS — R400 Somnolence: Secondary | ICD-10-CM

## 2024-07-24 DIAGNOSIS — G4733 Obstructive sleep apnea (adult) (pediatric): Secondary | ICD-10-CM

## 2024-07-24 DIAGNOSIS — R7303 Prediabetes: Secondary | ICD-10-CM | POA: Diagnosis not present

## 2024-07-24 DIAGNOSIS — I10 Essential (primary) hypertension: Secondary | ICD-10-CM | POA: Diagnosis not present

## 2024-07-24 DIAGNOSIS — Z1211 Encounter for screening for malignant neoplasm of colon: Secondary | ICD-10-CM

## 2024-07-24 DIAGNOSIS — Z114 Encounter for screening for human immunodeficiency virus [HIV]: Secondary | ICD-10-CM

## 2024-07-24 MED ORDER — PRAVASTATIN SODIUM 40 MG PO TABS: ORAL_TABLET | ORAL | 3 refills | Status: AC

## 2024-07-24 MED ORDER — AMLODIPINE BESYLATE 10 MG PO TABS: 10.0000 mg | ORAL_TABLET | Freq: Every day | ORAL | 2 refills | Status: AC

## 2024-07-24 NOTE — Assessment & Plan Note (Signed)
 Previous sleep study with mild osa.  Never treated. Some increased daytime fatigue.  Update HST ordered.

## 2024-07-24 NOTE — Patient Instructions (Signed)

## 2024-07-24 NOTE — Assessment & Plan Note (Signed)
 BP elevated today.  Return in 2 weeks for BP check.

## 2024-07-24 NOTE — Progress Notes (Signed)
 Randy Thompson - 48 y.o. male MRN [id number]  Date of birth: Aug 10, 1976  Subjective Chief Complaint  Patient presents with   Annual Exam    HPI Randy Thompson is a 48 y.o. male here today for annual exam.   He reports that he is doing well.   He is doing well with amlodipine  and pravastatin .  BP is elevated today.  He is moderately active.  He feels that his diet could be better.  He is a daily smoker.  He does consume EtOH occasionally.   Review of Systems  Constitutional:  Negative for chills, fever, malaise/fatigue and weight loss.  HENT:  Negative for congestion, ear pain and sore throat.   Eyes:  Negative for blurred vision, double vision and pain.  Respiratory:  Negative for cough and shortness of breath.   Cardiovascular:  Negative for chest pain and palpitations.  Gastrointestinal:  Negative for abdominal pain, blood in stool, constipation, heartburn and nausea.  Genitourinary:  Negative for dysuria and urgency.  Musculoskeletal:  Negative for joint pain and myalgias.  Neurological:  Negative for dizziness and headaches.  Endo/Heme/Allergies:  Does not bruise/bleed easily.  Psychiatric/Behavioral:  Negative for depression. The patient is not nervous/anxious and does not have insomnia.       Allergies  Allergen Reactions   Penicillins Rash    Past Medical History:  Diagnosis Date   Allergy    Asthma    childhood   Chronic chest pain 05/10/2017   Chronic low back pain    Hyperlipidemia    Hypertension    Mild obstructive sleep apnea 01/21/2017   Morbid obesity (HCC) 01/26/2014   Nocturnal leg cramps 05/10/2017   Prediabetes 05/10/2017    History reviewed. No pertinent surgical history.  Social History   Socioeconomic History   Marital status: Married    Spouse name: Not on file   Number of children: Not on file   Years of education: Not on file   Highest education level: Not on file  Occupational History   Not on file  Tobacco Use   Smoking status:  Every Day    Types: Cigarettes   Smokeless tobacco: Never  Vaping Use   Vaping status: Every Day  Substance and Sexual Activity   Alcohol use: Yes   Drug use: No   Sexual activity: Yes  Other Topics Concern   Not on file  Social History Narrative   Not on file   Social Drivers of Health   Financial Resource Strain: Low Risk  (07/24/2024)   Overall Financial Resource Strain (CARDIA)    Difficulty of Paying Living Expenses: Not hard at all  Food Insecurity: No Food Insecurity (07/24/2024)   Hunger Vital Sign    Worried About Running Out of Food in the Last Year: Never true    Ran Out of Food in the Last Year: Never true  Transportation Needs: No Transportation Needs (07/24/2024)   PRAPARE - Administrator, Civil Service (Medical): No    Lack of Transportation (Non-Medical): No  Physical Activity: Sufficiently Active (07/24/2024)   Exercise Vital Sign    Days of Exercise per Week: 5 days    Minutes of Exercise per Session: 40 min  Stress: No Stress Concern Present (07/24/2024)   [employer] of Occupational Health - Occupational Stress Questionnaire    Feeling of Stress: Not at all  Social Connections: Moderately Integrated (07/24/2024)   Social Connection and Isolation Panel    Frequency of Communication with  Friends and Family: More than three times a week    Frequency of Social Gatherings with Friends and Family: Once a week    Attends Religious Services: 1 to 4 times per year    Active Member of [employer] or Organizations: No    Attends Banker Meetings: Never    Marital Status: Married    Family History  Problem Relation Age of Onset   Cancer Mother 38       Lung   Asthma Father    Hypertension Father    Emphysema Father    Cirrhosis Father    Colon cancer Neg Hx    Prostate cancer Neg Hx    Heart attack Neg Hx    Stroke Neg Hx     Health Maintenance  Topic Date Due   HIV Screening  Never done   Hepatitis C Screening  Never done    Colonoscopy  Never done   Influenza Vaccine  12/08/2024 (Originally 04/10/2024)   Hepatitis B Vaccines 19-59 Average Risk (1 of 3 - 19+ 3-dose series) 07/24/2025 (Originally 03/22/1997)   COVID-19 Vaccine (2 - Pfizer risk series) 08/09/2025 (Originally 11/04/2019)   Pneumococcal Vaccine (3 of 3 - PCV20 or PCV21) 03/22/2028   DTaP/Tdap/Td (3 - Td or Tdap) 06/10/2029   HPV VACCINES  Aged Out   Meningococcal B Vaccine  Aged Out     ----------------------------------------------------------------------------------------------------------------------------------------------------------------------------------------------------------------- Physical Exam BP (!) 140/93 (BP Location: Left Arm, Patient Position: Sitting, Cuff Size: Large)   Pulse (!) 116   Ht 6' 1 (1.854 m)   Wt (!) 329 lb (149.2 kg)   SpO2 99%   BMI 43.41 kg/m   Physical Exam Constitutional:      General: He is not in acute distress. HENT:     Head: Normocephalic and atraumatic.     Right Ear: Tympanic membrane and external ear normal.     Left Ear: Tympanic membrane and external ear normal.  Eyes:     General: No scleral icterus. Neck:     Thyroid: No thyromegaly.  Cardiovascular:     Rate and Rhythm: Normal rate and regular rhythm.     Heart sounds: Normal heart sounds.  Pulmonary:     Effort: Pulmonary effort is normal.     Breath sounds: Normal breath sounds.  Abdominal:     General: Bowel sounds are normal. There is no distension.     Palpations: Abdomen is soft.     Tenderness: There is no abdominal tenderness. There is no guarding.  Musculoskeletal:     Cervical back: Normal range of motion.  Lymphadenopathy:     Cervical: No cervical adenopathy.  Skin:    General: Skin is warm and dry.     Findings: No rash.  Neurological:     Mental Status: He is alert and oriented to person, place, and time.     Cranial Nerves: No cranial nerve deficit.     Motor: No abnormal muscle tone.  Psychiatric:         Mood and Affect: Mood normal.        Behavior: Behavior normal.     ------------------------------------------------------------------------------------------------------------------------------------------------------------------------------------------------------------------- Assessment and Plan  Mild obstructive sleep apnea Previous sleep study with mild osa.  Never treated. Some increased daytime fatigue.  Update HST ordered.   Well adult exam Well adult Orders Placed This Encounter  Procedures   CMP14+EGFR   CBC with Differential/Platelet   Lipid Panel With LDL/HDL Ratio   HgB A1c   Hepatitis C  Antibody   HIV antibody (with reflex)   Ambulatory referral to Gastroenterology    Referral Priority:   Routine    Referral Type:   Consultation    Referral Reason:   Specialty Services Required    Number of Visits Requested:   1   Ambulatory referral to Sleep Studies    Referral Priority:   Routine    Referral Type:   Consultation    Referral Reason:   Specialty Services Required    Number of Visits Requested:   1  Screening: Per Lab orders.  Colon Cancer screening ordered.  Immunizations: Declined flu vaccine Anticipatory guidance/risk factor reduction.  Counseled on smoking cessation as well as weight loss.  Additional recommendations per AVS.  Hypertension goal BP (blood pressure) < 130/80 BP elevated today.  Return in 2 weeks for BP check.    Meds ordered this encounter  Medications   amLODipine  (NORVASC ) 10 MG tablet    Sig: Take 1 tablet (10 mg total) by mouth daily.    Dispense:  90 tablet    Refill:  2   pravastatin  (PRAVACHOL ) 40 MG tablet    Sig: Take 1 tablet by mouth once daily    Dispense:  90 tablet    Refill:  3    Return in about 2 weeks (around 08/07/2024) for nurse visit for BP check.

## 2024-07-24 NOTE — Assessment & Plan Note (Signed)
 Well adult Orders Placed This Encounter  Procedures   CMP14+EGFR   CBC with Differential/Platelet   Lipid Panel With LDL/HDL Ratio   HgB A1c   Hepatitis C Antibody   HIV antibody (with reflex)   Ambulatory referral to Gastroenterology    Referral Priority:   Routine    Referral Type:   Consultation    Referral Reason:   Specialty Services Required    Number of Visits Requested:   1   Ambulatory referral to Sleep Studies    Referral Priority:   Routine    Referral Type:   Consultation    Referral Reason:   Specialty Services Required    Number of Visits Requested:   1  Screening: Per Lab orders.  Colon Cancer screening ordered.  Immunizations: Declined flu vaccine Anticipatory guidance/risk factor reduction.  Counseled on smoking cessation as well as weight loss.  Additional recommendations per AVS.

## 2024-07-25 LAB — CMP14+EGFR
ALT: 30 IU/L (ref 0–44)
AST: 24 IU/L (ref 0–40)
Albumin: 4.6 g/dL (ref 4.1–5.1)
Alkaline Phosphatase: 81 IU/L (ref 47–123)
BUN/Creatinine Ratio: 7 — ABNORMAL LOW (ref 9–20)
BUN: 8 mg/dL (ref 6–24)
Bilirubin Total: <0.2 mg/dL (ref 0.0–1.2)
CO2: 22 mmol/L (ref 20–29)
Calcium: 9.6 mg/dL (ref 8.7–10.2)
Chloride: 104 mmol/L (ref 96–106)
Creatinine, Ser: 1.13 mg/dL (ref 0.76–1.27)
Globulin, Total: 2.7 g/dL (ref 1.5–4.5)
Glucose: 103 mg/dL — ABNORMAL HIGH (ref 70–99)
Potassium: 4.2 mmol/L (ref 3.5–5.2)
Sodium: 140 mmol/L (ref 134–144)
Total Protein: 7.3 g/dL (ref 6.0–8.5)
eGFR: 81 mL/min/1.73 (ref >59)

## 2024-07-25 LAB — HEPATITIS C ANTIBODY: Hep C Virus Ab: NONREACTIVE

## 2024-07-25 LAB — HIV ANTIBODY (ROUTINE TESTING W REFLEX): HIV Screen 4th Generation wRfx: NONREACTIVE

## 2024-07-25 LAB — LIPID PANEL WITH LDL/HDL RATIO
Cholesterol, Total: 186 mg/dL (ref 100–199)
HDL: 38 mg/dL — ABNORMAL LOW (ref >39)
LDL Chol Calc (NIH): 117 mg/dL — ABNORMAL HIGH (ref 0–99)
LDL/HDL Ratio: 3.1 ratio (ref 0.0–3.6)
Triglycerides: 172 mg/dL — ABNORMAL HIGH (ref 0–149)
VLDL Cholesterol Cal: 31 mg/dL (ref 5–40)

## 2024-07-25 LAB — CBC WITH DIFFERENTIAL/PLATELET
Basophils Absolute: 0 x10E3/uL (ref 0.0–0.2)
Basos: 1 %
EOS (ABSOLUTE): 0.1 x10E3/uL (ref 0.0–0.4)
Eos: 2 %
Hematocrit: 49.6 % (ref 37.5–51.0)
Hemoglobin: 16.4 g/dL (ref 13.0–17.7)
Immature Grans (Abs): 0 x10E3/uL (ref 0.0–0.1)
Immature Granulocytes: 0 %
Lymphocytes Absolute: 3.2 x10E3/uL — ABNORMAL HIGH (ref 0.7–3.1)
Lymphs: 40 %
MCH: 29.5 pg (ref 26.6–33.0)
MCHC: 33.1 g/dL (ref 31.5–35.7)
MCV: 89 fL (ref 79–97)
Monocytes Absolute: 0.7 x10E3/uL (ref 0.1–0.9)
Monocytes: 9 %
Neutrophils Absolute: 3.8 x10E3/uL (ref 1.4–7.0)
Neutrophils: 48 %
Platelets: 329 x10E3/uL (ref 150–450)
RBC: 5.56 x10E6/uL (ref 4.14–5.80)
RDW: 15.4 % (ref 11.6–15.4)
WBC: 7.9 x10E3/uL (ref 3.4–10.8)

## 2024-07-25 LAB — HEMOGLOBIN A1C
Est. average glucose Bld gHb Est-mCnc: 123 mg/dL
Hgb A1c MFr Bld: 5.9 % — ABNORMAL HIGH (ref 4.8–5.6)

## 2024-08-09 ENCOUNTER — Ambulatory Visit: Payer: Self-pay | Admitting: Family Medicine

## 2024-08-14 ENCOUNTER — Ambulatory Visit

## 2025-01-22 ENCOUNTER — Ambulatory Visit: Admitting: Family Medicine
